# Patient Record
Sex: Female | Born: 1995 | Race: White | Hispanic: Yes | Marital: Single | Smoking: Current every day smoker
Health system: Southern US, Community
[De-identification: ages and names within clinical notes are randomized; demographics above are authoritative.]

## PROBLEM LIST (undated history)

## (undated) DIAGNOSIS — K567 Ileus, unspecified: Secondary | ICD-10-CM

## (undated) HISTORY — PX: CHOLECYSTECTOMY: SHX55

## (undated) HISTORY — PX: APPENDECTOMY: SHX54

---

## 2017-06-06 ENCOUNTER — Encounter (HOSPITAL_COMMUNITY): Payer: Self-pay

## 2017-06-06 ENCOUNTER — Emergency Department (HOSPITAL_COMMUNITY): Payer: BLUE CROSS/BLUE SHIELD

## 2017-06-06 ENCOUNTER — Inpatient Hospital Stay (HOSPITAL_COMMUNITY)
Admission: EM | Admit: 2017-06-06 | Discharge: 2017-06-11 | DRG: 880 | Discharge status: Against Medical Advice | Payer: BLUE CROSS/BLUE SHIELD | Attending: Internal Medicine | Admitting: Internal Medicine

## 2017-06-06 DIAGNOSIS — J9600 Acute respiratory failure, unspecified whether with hypoxia or hypercapnia: Secondary | ICD-10-CM | POA: Diagnosis not present

## 2017-06-06 DIAGNOSIS — R1032 Left lower quadrant pain: Secondary | ICD-10-CM | POA: Diagnosis not present

## 2017-06-06 DIAGNOSIS — G894 Chronic pain syndrome: Secondary | ICD-10-CM | POA: Diagnosis present

## 2017-06-06 DIAGNOSIS — F445 Conversion disorder with seizures or convulsions: Principal | ICD-10-CM | POA: Diagnosis present

## 2017-06-06 DIAGNOSIS — Z9049 Acquired absence of other specified parts of digestive tract: Secondary | ICD-10-CM

## 2017-06-06 DIAGNOSIS — F1210 Cannabis abuse, uncomplicated: Secondary | ICD-10-CM | POA: Diagnosis present

## 2017-06-06 DIAGNOSIS — Z978 Presence of other specified devices: Secondary | ICD-10-CM

## 2017-06-06 DIAGNOSIS — F1123 Opioid dependence with withdrawal: Secondary | ICD-10-CM | POA: Diagnosis present

## 2017-06-06 DIAGNOSIS — E876 Hypokalemia: Secondary | ICD-10-CM | POA: Diagnosis not present

## 2017-06-06 DIAGNOSIS — N760 Acute vaginitis: Secondary | ICD-10-CM | POA: Diagnosis present

## 2017-06-06 DIAGNOSIS — R569 Unspecified convulsions: Secondary | ICD-10-CM | POA: Diagnosis not present

## 2017-06-06 DIAGNOSIS — F1020 Alcohol dependence, uncomplicated: Secondary | ICD-10-CM | POA: Diagnosis present

## 2017-06-06 DIAGNOSIS — G930 Cerebral cysts: Secondary | ICD-10-CM | POA: Diagnosis present

## 2017-06-06 DIAGNOSIS — F1410 Cocaine abuse, uncomplicated: Secondary | ICD-10-CM | POA: Diagnosis present

## 2017-06-06 DIAGNOSIS — F17210 Nicotine dependence, cigarettes, uncomplicated: Secondary | ICD-10-CM | POA: Diagnosis present

## 2017-06-06 DIAGNOSIS — J9601 Acute respiratory failure with hypoxia: Secondary | ICD-10-CM | POA: Diagnosis present

## 2017-06-06 DIAGNOSIS — R109 Unspecified abdominal pain: Secondary | ICD-10-CM

## 2017-06-06 DIAGNOSIS — A749 Chlamydial infection, unspecified: Secondary | ICD-10-CM | POA: Diagnosis present

## 2017-06-06 DIAGNOSIS — B9689 Other specified bacterial agents as the cause of diseases classified elsewhere: Secondary | ICD-10-CM | POA: Diagnosis present

## 2017-06-06 DIAGNOSIS — F1920 Other psychoactive substance dependence, uncomplicated: Secondary | ICD-10-CM

## 2017-06-06 HISTORY — DX: Ileus, unspecified: K56.7

## 2017-06-06 LAB — COMPREHENSIVE METABOLIC PANEL
ALT: 150 U/L — ABNORMAL HIGH (ref 14–54)
ANION GAP: 10 (ref 5–15)
AST: 90 U/L — AB (ref 15–41)
Albumin: 4.6 g/dL (ref 3.5–5.0)
Alkaline Phosphatase: 57 U/L (ref 38–126)
BILIRUBIN TOTAL: 0.3 mg/dL (ref 0.3–1.2)
BUN: 12 mg/dL (ref 6–20)
CHLORIDE: 105 mmol/L (ref 101–111)
CO2: 24 mmol/L (ref 22–32)
Calcium: 9.9 mg/dL (ref 8.9–10.3)
Creatinine, Ser: 0.71 mg/dL (ref 0.44–1.00)
Glucose, Bld: 117 mg/dL — ABNORMAL HIGH (ref 65–99)
POTASSIUM: 4.1 mmol/L (ref 3.5–5.1)
Sodium: 139 mmol/L (ref 135–145)
TOTAL PROTEIN: 8.5 g/dL — AB (ref 6.5–8.1)

## 2017-06-06 LAB — WET PREP, GENITAL
Sperm: NONE SEEN
Trich, Wet Prep: NONE SEEN
Yeast Wet Prep HPF POC: NONE SEEN

## 2017-06-06 LAB — CBC
HEMATOCRIT: 41 % (ref 36.0–46.0)
Hemoglobin: 14.1 g/dL (ref 12.0–15.0)
MCH: 28.7 pg (ref 26.0–34.0)
MCHC: 34.4 g/dL (ref 30.0–36.0)
MCV: 83.5 fL (ref 78.0–100.0)
Platelets: 244 10*3/uL (ref 150–400)
RBC: 4.91 MIL/uL (ref 3.87–5.11)
RDW: 13.6 % (ref 11.5–15.5)
WBC: 10.1 10*3/uL (ref 4.0–10.5)

## 2017-06-06 LAB — URINALYSIS, ROUTINE W REFLEX MICROSCOPIC
BILIRUBIN URINE: NEGATIVE
Glucose, UA: NEGATIVE mg/dL
Hgb urine dipstick: NEGATIVE
KETONES UR: 20 mg/dL — AB
LEUKOCYTES UA: NEGATIVE
NITRITE: NEGATIVE
PH: 5 (ref 5.0–8.0)
Protein, ur: NEGATIVE mg/dL
Specific Gravity, Urine: 1.023 (ref 1.005–1.030)

## 2017-06-06 LAB — ACETAMINOPHEN LEVEL

## 2017-06-06 LAB — MAGNESIUM: Magnesium: 2 mg/dL (ref 1.7–2.4)

## 2017-06-06 LAB — I-STAT BETA HCG BLOOD, ED (MC, WL, AP ONLY): I-stat hCG, quantitative: <5 m[IU]/mL (ref <5)

## 2017-06-06 LAB — LIPASE, BLOOD
LIPASE: 29 U/L (ref 11–51)
Lipase: 29 U/L (ref 11–51)

## 2017-06-06 LAB — CBG MONITORING, ED: Glucose-Capillary: 120 mg/dL — ABNORMAL HIGH (ref 65–99)

## 2017-06-06 LAB — RAPID URINE DRUG SCREEN, HOSP PERFORMED
AMPHETAMINES: NOT DETECTED
BARBITURATES: NOT DETECTED
BENZODIAZEPINES: POSITIVE — AB
COCAINE: POSITIVE — AB
Opiates: POSITIVE — AB
TETRAHYDROCANNABINOL: POSITIVE — AB

## 2017-06-06 LAB — TRIGLYCERIDES: Triglycerides: 54 mg/dL (ref <150)

## 2017-06-06 LAB — AMYLASE: AMYLASE: 81 U/L (ref 28–100)

## 2017-06-06 LAB — MRSA PCR SCREENING: MRSA BY PCR: POSITIVE — AB

## 2017-06-06 LAB — SALICYLATE LEVEL

## 2017-06-06 MED ORDER — HYDROMORPHONE HCL 1 MG/ML IJ SOLN
0.5000 mg | Freq: Once | INTRAMUSCULAR | Status: AC
  Administered 2017-06-06: 0.5 mg via INTRAVENOUS
  Filled 2017-06-06: qty 1

## 2017-06-06 MED ORDER — LIDOCAINE HCL 1 % IJ SOLN
INTRAMUSCULAR | Status: AC
  Filled 2017-06-06: qty 20

## 2017-06-06 MED ORDER — AZITHROMYCIN 250 MG PO TABS: 1000.0000 mg | ORAL_TABLET | Freq: Once | ORAL | Status: DC

## 2017-06-06 MED ORDER — PROPOFOL 1000 MG/100ML IV EMUL
0.0000 ug/kg/min | INTRAVENOUS | Status: DC
  Filled 2017-06-06: qty 100

## 2017-06-06 MED ORDER — FENTANYL CITRATE (PF) 100 MCG/2ML IJ SOLN: 100.0000 ug | INTRAMUSCULAR | Status: DC | PRN

## 2017-06-06 MED ORDER — IOPAMIDOL (ISOVUE-300) INJECTION 61%
INTRAVENOUS | Status: AC
  Filled 2017-06-06: qty 100

## 2017-06-06 MED ORDER — LORAZEPAM 2 MG/ML IJ SOLN
2.0000 mg | Freq: Once | INTRAMUSCULAR | Status: AC
  Administered 2017-06-06: 2 mg via INTRAVENOUS
  Filled 2017-06-06: qty 1

## 2017-06-06 MED ORDER — MIDAZOLAM HCL 2 MG/2ML IJ SOLN
2.0000 mg | Freq: Once | INTRAMUSCULAR | Status: AC
  Administered 2017-06-06: 2 mg via INTRAVENOUS

## 2017-06-06 MED ORDER — AMMONIA AROMATIC IN INHA
RESPIRATORY_TRACT | Status: AC
  Filled 2017-06-06: qty 10

## 2017-06-06 MED ORDER — LORAZEPAM 2 MG/ML IJ SOLN
INTRAMUSCULAR | Status: AC
  Filled 2017-06-06: qty 1

## 2017-06-06 MED ORDER — SODIUM CHLORIDE 0.9 % IV SOLN
750.0000 mg | Freq: Two times a day (BID) | INTRAVENOUS | Status: DC
  Administered 2017-06-06 – 2017-06-07 (×2): 750 mg via INTRAVENOUS
  Filled 2017-06-06 (×3): qty 7.5

## 2017-06-06 MED ORDER — CEFTRIAXONE SODIUM 250 MG IJ SOLR
250.0000 mg | Freq: Once | INTRAMUSCULAR | Status: AC
  Administered 2017-06-06: 250 mg via INTRAMUSCULAR
  Filled 2017-06-06: qty 250

## 2017-06-06 MED ORDER — MIDAZOLAM HCL 2 MG/2ML IJ SOLN
INTRAMUSCULAR | Status: AC
  Filled 2017-06-06: qty 2

## 2017-06-06 MED ORDER — FAMOTIDINE 20 MG PO TABS: 20.0000 mg | ORAL_TABLET | Freq: Two times a day (BID) | ORAL | Status: DC

## 2017-06-06 MED ORDER — IOPAMIDOL (ISOVUE-300) INJECTION 61%
100.0000 mL | Freq: Once | INTRAVENOUS | Status: AC | PRN
  Administered 2017-06-06: 100 mL via INTRAVENOUS

## 2017-06-06 MED ORDER — SODIUM CHLORIDE 0.9 % IV SOLN
2000.0000 mg | Freq: Once | INTRAVENOUS | Status: AC
  Administered 2017-06-06: 2000 mg via INTRAVENOUS
  Filled 2017-06-06: qty 20

## 2017-06-06 MED ORDER — METRONIDAZOLE 50 MG/ML ORAL SUSPENSION
500.0000 mg | Freq: Two times a day (BID) | ORAL | Status: DC
  Administered 2017-06-07 – 2017-06-09 (×3): 500 mg
  Filled 2017-06-06 (×6): qty 10

## 2017-06-06 MED ORDER — PROPOFOL 1000 MG/100ML IV EMUL
0.0000 ug/kg/min | INTRAVENOUS | Status: DC
  Administered 2017-06-06: 5 ug/kg/min via INTRAVENOUS

## 2017-06-06 MED ORDER — MIDAZOLAM HCL 2 MG/2ML IJ SOLN
1.0000 mg | Freq: Once | INTRAMUSCULAR | Status: AC
  Administered 2017-06-06: 1 mg via INTRAVENOUS

## 2017-06-06 MED ORDER — ROCURONIUM BROMIDE 50 MG/5ML IV SOLN
INTRAVENOUS | Status: DC | PRN
  Administered 2017-06-06: 60 mg via INTRAVENOUS

## 2017-06-06 MED ORDER — PROPOFOL 1000 MG/100ML IV EMUL
INTRAVENOUS | Status: AC
  Filled 2017-06-06: qty 100

## 2017-06-06 MED ORDER — FENTANYL CITRATE (PF) 100 MCG/2ML IJ SOLN
50.0000 ug | Freq: Once | INTRAMUSCULAR | Status: AC
  Administered 2017-06-06: 50 ug via INTRAVENOUS
  Filled 2017-06-06: qty 2

## 2017-06-06 MED ORDER — ONDANSETRON HCL 4 MG/2ML IJ SOLN
4.0000 mg | Freq: Once | INTRAMUSCULAR | Status: AC
  Administered 2017-06-06: 4 mg via INTRAVENOUS
  Filled 2017-06-06: qty 2

## 2017-06-06 MED ORDER — ETOMIDATE 2 MG/ML IV SOLN
INTRAVENOUS | Status: AC | PRN
  Administered 2017-06-06: 20 mg via INTRAVENOUS

## 2017-06-06 MED ORDER — MIDAZOLAM HCL 2 MG/2ML IJ SOLN: 1.0000 mg | Freq: Once | INTRAMUSCULAR | Status: DC

## 2017-06-06 MED ORDER — LORAZEPAM 2 MG/ML IJ SOLN
INTRAMUSCULAR | Status: AC | PRN
Start: 2017-06-06 — End: 2017-06-06
  Administered 2017-06-06 (×2): 2 mg via INTRAVENOUS

## 2017-06-06 MED ORDER — DEXMEDETOMIDINE HCL IN NACL 200 MCG/50ML IV SOLN
0.4000 ug/kg/h | INTRAVENOUS | Status: DC
  Administered 2017-06-06: 0.8 ug/kg/h via INTRAVENOUS
  Administered 2017-06-06 – 2017-06-07 (×2): 0.4 ug/kg/h via INTRAVENOUS
  Filled 2017-06-06 (×4): qty 50

## 2017-06-06 MED ORDER — HEPARIN SODIUM (PORCINE) 5000 UNIT/ML IJ SOLN
5000.0000 [IU] | Freq: Three times a day (TID) | INTRAMUSCULAR | Status: DC
  Administered 2017-06-06 – 2017-06-10 (×8): 5000 [IU] via SUBCUTANEOUS
  Filled 2017-06-06 (×10): qty 1

## 2017-06-06 MED ORDER — LORAZEPAM 2 MG/ML IJ SOLN
1.0000 mg | Freq: Once | INTRAMUSCULAR | Status: DC
  Filled 2017-06-06: qty 1

## 2017-06-06 MED ORDER — SODIUM CHLORIDE 0.9 % IV SOLN
250.0000 mL | INTRAVENOUS | Status: DC | PRN
  Administered 2017-06-07: 250 mL via INTRAVENOUS

## 2017-06-06 MED ORDER — SODIUM CHLORIDE 0.9 % IV BOLUS (SEPSIS)
1000.0000 mL | Freq: Once | INTRAVENOUS | Status: AC
  Administered 2017-06-06: 1000 mL via INTRAVENOUS

## 2017-06-06 MED ORDER — FAMOTIDINE 20 MG PO TABS
20.0000 mg | ORAL_TABLET | Freq: Two times a day (BID) | ORAL | Status: DC
  Administered 2017-06-07 – 2017-06-10 (×8): 20 mg via ORAL
  Filled 2017-06-06 (×8): qty 1
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
## 2017-06-06 NOTE — ED Triage Notes (Signed)
Patient c/o LLQ pain. Patient states, that she forgot her meds for Ileus and liver problems. Patient sattes the pain started at 0730 today.

## 2017-06-06 NOTE — Progress Notes (Signed)
eLink Physician-Brief Progress Note Patient Name: Roe RutherfordSarah Heidt DOB: 02/13/1996 MRN: [id number]   Date of Service  06/06/2017  HPI/Events of Note  Pt started to have intermittent seizure   eICU Interventions  Given versed 1mg  and then 2mg  Started on precedex drip with no improvement Discussed with neuro who will evaluate the patient      Intervention Category Evaluation Type: Other  Avien Taha 06/06/2017, 8:40 PM

## 2017-06-06 NOTE — Consult Note (Signed)
Seizures- likely substance abuse related  - Load with 2 g Keppra, 750 mg BID maintenance - Ct head shows no acute findings, arachnoid cyst - UDS +ve for cocaine, Opiates, benzo and barbiturates, THC - patient moving all 4 extremities while intubated, no focal deficits Routine EEG   Will continue to follow

## 2017-06-06 NOTE — ED Provider Notes (Signed)
[facility]-EMERGENCY DEPT Provider Note   CSN: [id number] Arrival date & time: 06/06/17  1106     History   Chief Complaint Chief Complaint  Patient presents with  . Emesis  . Abdominal Pain    HPI Connie Mckay is a 21 y.o. female.  The history is provided by the patient and medical records. No language interpreter was used.  Emesis   Associated symptoms include abdominal pain. Pertinent negatives include no diarrhea.  Abdominal Pain   Associated symptoms include nausea and vomiting. Pertinent negatives include diarrhea.  Connie Mckay is a 21 y.o. female who presents to the Emergency Department complaining of acute onset of left lower quadrant pain which began this morning. Pain is constant, 10/10. Unable to characterize, stating that "It just hurts". Denies radiation of the pain. Notes somewhat similar episode of abdominal pain in the past when her "liver labs were high". No EMR records available at this time. Per patient, history of elevated liver labs which she takes medication for, but does not know any further information or name of medication. 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 Also notes history of hepatitis. No other known medical problems. No medications taken prior to arrival for symptoms. No alleviating or aggravating factors noted. Associated with nausea and vomiting. No fevers, chills, back pain, urinary symptoms, back pain, chest pain or shortness of breath.    Past Medical History:  Diagnosis Date  . Ileus [facility](HCC)     Patient Active Problem List   Diagnosis Date Noted  . Abdominal pain 06/06/2017    Past Surgical History:  Procedure Laterality Date  . APPENDECTOMY    . CHOLECYSTECTOMY      OB History    No data available       Home Medications    Prior to Admission medications   Medication Sig Start Date End Date Taking? Authorizing Provider  AMOXICILLIN PO Take 1 tablet by mouth every 4 (four) hours. Take 1 tab  every 4 hours for 2 weeks   Yes [provider]  HYDROmorphone (DILAUDID) 2 MG tablet Take 2 mg by mouth every 4 (four) hours as needed for severe pain.   Yes [provider]    Family History History reviewed. No pertinent family history.  Social History Social History  Substance Use Topics  . Smoking status: Current Every Day Smoker    Packs/day: 0.50    Types: Cigarettes  . Smokeless tobacco: Never Used  . Alcohol use No     Allergies   Patient has no known allergies.   Review of Systems Review of Systems  Gastrointestinal: Positive for abdominal pain, nausea and vomiting. Negative for blood in stool and diarrhea.  All other systems reviewed and are negative.    Physical Exam Updated Vital Signs BP (!) 91/52   Pulse 86   Temp 98.4 F (36.9 C)   Resp 18   Ht 5\' 2"  (1.575 m)   Wt 58.1 kg (128 lb)   LMP 05/30/2017 (Approximate) Comment: pt intubated   SpO2 98%   BMI 23.41 kg/m   Physical Exam  Constitutional: She is oriented to person, place, and time. She appears well-developed and well-nourished. No distress.  Appears uncomfortable and in pain.  HENT:  Head: Normocephalic and atraumatic.  Cardiovascular: Normal rate, regular rhythm and normal heart sounds.   No murmur heard. Pulmonary/Chest: Effort normal and breath sounds normal. No respiratory distress.  Abdominal: Soft. She exhibits no distension.  Tenderness to palpation of left lower quadrant with guarding.  Musculoskeletal: She exhibits no edema.  Neurological: She is alert and oriented to person, place, and time.  Skin: Skin is warm and dry.  Nursing note and vitals reviewed.    ED Treatments / Results  Labs (all labs ordered are listed, but only abnormal results are displayed) Labs Reviewed  WET PREP, GENITAL - Abnormal; Notable for the following:       Result Value   Clue Cells Wet Prep HPF POC PRESENT (*)    WBC, Wet Prep HPF POC MANY (*)    All other components within  normal limits  COMPREHENSIVE METABOLIC PANEL - Abnormal; Notable for the following:    Glucose, Bld 117 (*)    Total Protein 8.5 (*)    AST 90 (*)    ALT 150 (*)    All other components within normal limits  URINALYSIS, ROUTINE W REFLEX MICROSCOPIC - Abnormal; Notable for the following:    APPearance HAZY (*)    Ketones, ur 20 (*)    All other components within normal limits  RAPID URINE DRUG SCREEN, HOSP PERFORMED - Abnormal; Notable for the following:    Opiates POSITIVE (*)    Cocaine POSITIVE (*)    Benzodiazepines POSITIVE (*)    Tetrahydrocannabinol POSITIVE (*)    All other components within normal limits  CBG MONITORING, ED - Abnormal; Notable for the following:    Glucose-Capillary 120 (*)    All other components within normal limits  LIPASE, BLOOD  CBC  MAGNESIUM  TRIGLYCERIDES  HIV ANTIBODY (ROUTINE TESTING)  AMYLASE  LIPASE, BLOOD  HEPATITIS PANEL, ACUTE  ACETAMINOPHEN LEVEL  SALICYLATE LEVEL  LIPASE, BLOOD  TRIGLYCERIDES  I-STAT BETA HCG BLOOD, ED ([facility], [facility], [facility] ONLY)  GC/CHLAMYDIA PROBE AMP ([place]) NOT AT [facility]    EKG  EKG Interpretation None       Radiology Ct Head Wo Contrast  Result Date: 06/06/2017 CLINICAL DATA:  21 y/o F; new seizure. Now intubated. Initial examination. EXAM: CT HEAD WITHOUT CONTRAST TECHNIQUE: Contiguous axial images were obtained from the base of the skull through the vertex without intravenous contrast. COMPARISON:  None. FINDINGS: Brain: No evidence of acute infarct, intracranial hemorrhage, hydrocephalus, focal mass effect of the brain parenchyma. There is a prominent left middle cranial fossa extra-axial space measuring 28 x 30 x 22 mm (AP x ML x CC series 2, image 6 and series 4, image 28) probably representing an arachnoid cyst. The structure exerts mild mass effect on the left anteromedial temporal lobe. Vascular: No hyperdense vessel or unexpected calcification. Skull: Normal. Negative for fracture or focal lesion.  Sinuses/Orbits: No acute finding. Other: None. IMPRESSION: 1. No acute intracranial abnormality identified. 2. 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 The structure exerts mild mass effect on the left anteromedial temporal lobe. Consider MRI of the brain with and without contrast for further characterization Electronically Signed   By: Mitzi Hansen M.D.   On: 06/06/2017 16:29   Dg Chest Port 1 View  Result Date: 06/06/2017 CLINICAL DATA:  Status post intubation and OG tube placement today. EXAM: PORTABLE CHEST 1 VIEW COMPARISON:  None. FINDINGS: Endotracheal tube is in place with the tip in good position 2.5 cm above the carina. OG tube courses into the stomach and below the inferior margin of the film. Lungs are clear. Heart size is normal. No pneumothorax or pleural fluid. No bony abnormality. IMPRESSION:  ETT and NG tube in good position. Lungs clear.  No acute disease. Electronically Signed   By: Drusilla Kanner M.D.   On: 06/06/2017 16:14    Procedures Procedures (including critical care time)  CRITICAL CARE Performed by: Chase Picket Ward  Total critical care time: 45 minutes  Critical care time was exclusive of separately billable procedures and treating other patients.  Critical care was necessary to treat or prevent imminent or life-threatening deterioration.  Critical care was time spent personally by me on the following activities: development of treatment plan with patient and/or surrogate as well as nursing, discussions with consultants, evaluation of patient's response to treatment, examination of patient, obtaining history from patient or surrogate, ordering and performing treatments and interventions, ordering and review of laboratory studies, ordering and review of radiographic studies, pulse oximetry and re-evaluation of patient's condition.   Medications Ordered in ED Medications  iopamidol  (ISOVUE-300) 61 % injection (not administered)  ammonia inhalant (  Not Given 06/06/17 1519)  LORazepam (ATIVAN) injection 1 mg (0 mg Intravenous Not Given 06/06/17 1508)  LORazepam (ATIVAN) injection 1 mg (1 mg Intravenous Not Given 06/06/17 1509)  cefTRIAXone (ROCEPHIN) injection 250 mg (not administered)  0.9 %  sodium chloride infusion (not administered)  heparin injection 5,000 Units (not administered)  famotidine (PEPCID) tablet 20 mg (not administered)  fentaNYL (SUBLIMAZE) injection 100 mcg (not administered)  fentaNYL (SUBLIMAZE) injection 100 mcg (not administered)  propofol (DIPRIVAN) 1000 MG/100ML infusion (not administered)  metroNIDAZOLE (FLAGYL) 50 mg/ml oral suspension 500 mg (not administered)  sodium chloride 0.9 % bolus 1,000 mL (1,000 mLs Intravenous New Bag/Given 06/06/17 1239)  HYDROmorphone (DILAUDID) injection 0.5 mg (0.5 mg Intravenous Given 06/06/17 1240)  ondansetron (ZOFRAN) injection 4 mg (4 mg Intravenous Given 06/06/17 1242)  iopamidol (ISOVUE-300) 61 % injection 100 mL (100 mLs Intravenous Contrast Given 06/06/17 1608)  LORazepam (ATIVAN) injection (2 mg Intravenous Given 06/06/17 1356)  etomidate (AMIDATE) injection (20 mg Intravenous Given 06/06/17 1401)  levETIRAcetam (KEPPRA) 2,000 mg in sodium chloride 0.9 % 100 mL IVPB (0 mg Intravenous Stopped 06/06/17 1512)  fentaNYL (SUBLIMAZE) injection 50 mcg (50 mcg Intravenous Given 06/06/17 1531)  LORazepam (ATIVAN) injection 2 mg (2 mg Intravenous Given 06/06/17 1533)     Initial Impression / Assessment and Plan / ED Course  I have reviewed the triage vital signs and the nursing notes.  Pertinent labs & imaging results that were available during my care of the patient were reviewed by me and considered in my medical decision making (see chart for details).    Connie Mckay is a 21 y.o. female who presented to ED initially for acute onset of left lower quadrant pain. Abdomen with significant tenderness to LLQ with guarding.  Appeared quite uncomfortable. Pain and nausea meds ordered along with fluids and CT abd/pelvis. Nursing staff informed me of seizure-like activity. Myself and Dr. Rhunette Croft at bedside. Patient received multiple doses of Ativan with no improvement in seizure-like activity. Decision was made to intubate to protect airway and control seizure. Intubation performed by Dr. Rhunette Croft. Patient placed on propofol gtt. Neurology consulted who recommends Keppra load which was given. Critical care consulted who will admit.   Patient seen by and discussed with Dr. Rhunette Croft who agrees with treatment plan.   Final Clinical Impressions(s) / ED Diagnoses   Final diagnoses:  Seizure-like activity (HCC)  Left lower quadrant pain    New Prescriptions New Prescriptions   No medications on file     Ward, Chase Picket, PA-C  06/06/17 1647    Derwood KaplanNanavati, Ankit, MD 06/06/17 1658

## 2017-06-06 NOTE — Procedures (Signed)
Extubation Procedure Note  Patient Details:   Name: Connie RutherfordSarah Mckay DOB: 12/21/1995 MRN: [id number]   Airway Documentation:     Evaluation  O2 sats: stable throughout Complications: No apparent complications Patient did tolerate procedure well. Bilateral Breath Sounds: Clear   No  Patient tolerated wean. Patient wild in bed. Positive for cuff leak. MD ordered to extubate. Patient extubated to room air. Patient unable to verbalize. RN and MD at bedside.   Ancil BoozerSmallwood, Brice Potteiger 06/06/2017, 6:21 PM

## 2017-06-06 NOTE — ED Notes (Signed)
Pt refused CT at this time due to feeling nauseated. Pt asked RN to leave room at this time.

## 2017-06-06 NOTE — Progress Notes (Signed)
Pt transported to ICU with Vent.

## 2017-06-06 NOTE — Progress Notes (Signed)
Upon rounding, RN found patient violently convulsing and gasping for air (became tachycardic and tachypneic). Patient initially responsive, but then became unresponsive. RN contacted E-Link via room camera and called charge RN. Patient intermittetly convulsing for 3-7 minutes with 1-2 min of rest. Patient given 3 mg of Versed in total and started on Precedex drip at 0.4 mcg. Precedex increased to 1.2 mcg per Mannam, MD. Patient responded well to medications and stopped seizure activity. Neuro MD assessed patient. Plan is for patient to [facility]. Vitals currently (HR 83 O2 95 BP 90/43 RR 24). Will decrease Precedex for more stable pressure per Deterding, MD. Will continue to monitor patient.
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
## 2017-06-06 NOTE — Progress Notes (Signed)
eLink Physician-Brief Progress Note Patient Name: Connie RutherfordSarah Valenti DOB: 12/25/1995 MRN: [id number]   Date of Service  06/06/2017  HPI/Events of Note  Discussion with Neuro.  Patient with seizure activity pseudo vs actual.  Also concern for unknown ingestion.   eICU Interventions  [facility] for cont EEG monitoring per Neuro recommendation Contact poison control for additional recommendations.     Intervention Category Intermediate Interventions: Other:  DETERDING,ELIZABETH 06/06/2017, 9:26 PM

## 2017-06-07 ENCOUNTER — Observation Stay (HOSPITAL_COMMUNITY): Payer: BLUE CROSS/BLUE SHIELD

## 2017-06-07 DIAGNOSIS — N760 Acute vaginitis: Secondary | ICD-10-CM | POA: Diagnosis present

## 2017-06-07 DIAGNOSIS — A749 Chlamydial infection, unspecified: Secondary | ICD-10-CM | POA: Diagnosis present

## 2017-06-07 DIAGNOSIS — Z9049 Acquired absence of other specified parts of digestive tract: Secondary | ICD-10-CM | POA: Diagnosis not present

## 2017-06-07 DIAGNOSIS — G930 Cerebral cysts: Secondary | ICD-10-CM | POA: Diagnosis present

## 2017-06-07 DIAGNOSIS — R1084 Generalized abdominal pain: Secondary | ICD-10-CM | POA: Diagnosis not present

## 2017-06-07 DIAGNOSIS — F1190 Opioid use, unspecified, uncomplicated: Secondary | ICD-10-CM | POA: Diagnosis not present

## 2017-06-07 DIAGNOSIS — F1290 Cannabis use, unspecified, uncomplicated: Secondary | ICD-10-CM | POA: Diagnosis not present

## 2017-06-07 DIAGNOSIS — F17210 Nicotine dependence, cigarettes, uncomplicated: Secondary | ICD-10-CM | POA: Diagnosis present

## 2017-06-07 DIAGNOSIS — Z634 Disappearance and death of family member: Secondary | ICD-10-CM | POA: Diagnosis not present

## 2017-06-07 DIAGNOSIS — R1032 Left lower quadrant pain: Secondary | ICD-10-CM | POA: Diagnosis present

## 2017-06-07 DIAGNOSIS — R109 Unspecified abdominal pain: Secondary | ICD-10-CM | POA: Diagnosis not present

## 2017-06-07 DIAGNOSIS — F1910 Other psychoactive substance abuse, uncomplicated: Secondary | ICD-10-CM | POA: Diagnosis not present

## 2017-06-07 DIAGNOSIS — F1123 Opioid dependence with withdrawal: Secondary | ICD-10-CM | POA: Diagnosis present

## 2017-06-07 DIAGNOSIS — F445 Conversion disorder with seizures or convulsions: Secondary | ICD-10-CM | POA: Diagnosis present

## 2017-06-07 DIAGNOSIS — F1490 Cocaine use, unspecified, uncomplicated: Secondary | ICD-10-CM | POA: Diagnosis not present

## 2017-06-07 DIAGNOSIS — B9689 Other specified bacterial agents as the cause of diseases classified elsewhere: Secondary | ICD-10-CM | POA: Diagnosis present

## 2017-06-07 DIAGNOSIS — F1920 Other psychoactive substance dependence, uncomplicated: Secondary | ICD-10-CM | POA: Diagnosis not present

## 2017-06-07 DIAGNOSIS — F1020 Alcohol dependence, uncomplicated: Secondary | ICD-10-CM | POA: Diagnosis present

## 2017-06-07 DIAGNOSIS — F1210 Cannabis abuse, uncomplicated: Secondary | ICD-10-CM | POA: Diagnosis present

## 2017-06-07 DIAGNOSIS — F1410 Cocaine abuse, uncomplicated: Secondary | ICD-10-CM | POA: Diagnosis present

## 2017-06-07 DIAGNOSIS — F1390 Sedative, hypnotic, or anxiolytic use, unspecified, uncomplicated: Secondary | ICD-10-CM | POA: Diagnosis not present

## 2017-06-07 DIAGNOSIS — E876 Hypokalemia: Secondary | ICD-10-CM | POA: Diagnosis not present

## 2017-06-07 DIAGNOSIS — R740 Nonspecific elevation of levels of transaminase and lactic acid dehydrogenase [LDH]: Secondary | ICD-10-CM | POA: Diagnosis not present

## 2017-06-07 DIAGNOSIS — G894 Chronic pain syndrome: Secondary | ICD-10-CM | POA: Diagnosis present

## 2017-06-07 DIAGNOSIS — J9601 Acute respiratory failure with hypoxia: Secondary | ICD-10-CM | POA: Diagnosis present

## 2017-06-07 DIAGNOSIS — G8929 Other chronic pain: Secondary | ICD-10-CM | POA: Diagnosis not present

## 2017-06-07 LAB — POCT I-STAT 3, ART BLOOD GAS (G3+)
ACID-BASE DEFICIT: 1 mmol/L (ref 0.0–2.0)
BICARBONATE: 22.3 mmol/L (ref 20.0–28.0)
O2 SAT: 98 %
PCO2 ART: 32.3 mmHg (ref 32.0–48.0)
PO2 ART: 94 mmHg (ref 83.0–108.0)
Patient temperature: 99.7
TCO2: 23 mmol/L (ref 0–100)
pH, Arterial: 7.449 (ref 7.350–7.450)

## 2017-06-07 LAB — BASIC METABOLIC PANEL
ANION GAP: 9 (ref 5–15)
Anion gap: 4 — ABNORMAL LOW (ref 5–15)
BUN: 8 mg/dL (ref 6–20)
BUN: 7 mg/dL (ref 6–20)
CALCIUM: 8.3 mg/dL — AB (ref 8.9–10.3)
CHLORIDE: 111 mmol/L (ref 101–111)
CO2: 21 mmol/L — ABNORMAL LOW (ref 22–32)
CO2: 24 mmol/L (ref 22–32)
CREATININE: 0.68 mg/dL (ref 0.44–1.00)
Calcium: 8.3 mg/dL — ABNORMAL LOW (ref 8.9–10.3)
Chloride: 110 mmol/L (ref 101–111)
Creatinine, Ser: 0.65 mg/dL (ref 0.44–1.00)
GFR calc Af Amer: >60 mL/min (ref >60)
GFR calc Af Amer: >60 mL/min (ref >60)
GFR calc non Af Amer: >60 mL/min (ref >60)
Glucose, Bld: 86 mg/dL (ref 65–99)
Glucose, Bld: 98 mg/dL (ref 65–99)
POTASSIUM: 3.7 mmol/L (ref 3.5–5.1)
POTASSIUM: 3.6 mmol/L (ref 3.5–5.1)
SODIUM: 140 mmol/L (ref 135–145)
Sodium: 139 mmol/L (ref 135–145)

## 2017-06-07 LAB — PROCALCITONIN

## 2017-06-07 LAB — HEPATIC FUNCTION PANEL
ALT: 99 U/L — AB (ref 14–54)
AST: 62 U/L — AB (ref 15–41)
Albumin: 3.3 g/dL — ABNORMAL LOW (ref 3.5–5.0)
Alkaline Phosphatase: 41 U/L (ref 38–126)
BILIRUBIN INDIRECT: 0.6 mg/dL (ref 0.3–0.9)
Bilirubin, Direct: 0.2 mg/dL (ref 0.1–0.5)
TOTAL PROTEIN: 6.1 g/dL — AB (ref 6.5–8.1)
Total Bilirubin: 0.8 mg/dL (ref 0.3–1.2)

## 2017-06-07 LAB — TROPONIN I: Troponin I: <0.03 ng/mL (ref <0.03)

## 2017-06-07 LAB — LIPASE, BLOOD: Lipase: 30 U/L (ref 11–51)

## 2017-06-07 LAB — CK TOTAL AND CKMB (NOT AT ARMC)
CK, MB: 0.8 ng/mL (ref 0.5–5.0)
RELATIVE INDEX: INVALID (ref 0.0–2.5)
Total CK: 63 U/L (ref 38–234)

## 2017-06-07 LAB — LACTIC ACID, PLASMA
Lactic Acid, Venous: 0.9 mmol/L (ref 0.5–1.9)
Lactic Acid, Venous: 0.7 mmol/L (ref 0.5–1.9)

## 2017-06-07 LAB — HIV ANTIBODY (ROUTINE TESTING W REFLEX): HIV SCREEN 4TH GENERATION: NONREACTIVE

## 2017-06-07 MED ORDER — LORAZEPAM 2 MG/ML IJ SOLN
1.0000 mg | INTRAMUSCULAR | Status: DC | PRN
  Administered 2017-06-07: 1 mg via INTRAVENOUS
  Filled 2017-06-07: qty 1

## 2017-06-07 MED ORDER — LORAZEPAM 2 MG/ML IJ SOLN
1.0000 mg | INTRAMUSCULAR | Status: DC | PRN
  Administered 2017-06-08: 2 mg via INTRAVENOUS
  Filled 2017-06-07 (×2): qty 1

## 2017-06-07 MED ORDER — SODIUM CHLORIDE 0.9 % IV SOLN
INTRAVENOUS | Status: DC
  Administered 2017-06-07 – 2017-06-08 (×2): via INTRAVENOUS

## 2017-06-07 MED ORDER — FENTANYL CITRATE (PF) 100 MCG/2ML IJ SOLN
50.0000 ug | INTRAMUSCULAR | Status: DC | PRN
Start: 2017-06-07 — End: 2017-06-09
  Administered 2017-06-07 – 2017-06-08 (×4): 100 ug via INTRAVENOUS
  Administered 2017-06-09: 50 ug via INTRAVENOUS
  Administered 2017-06-09: 100 ug via INTRAVENOUS
  Filled 2017-06-07 (×8): qty 2

## 2017-06-07 MED ORDER — LORAZEPAM 2 MG/ML IJ SOLN
INTRAMUSCULAR | Status: AC
  Administered 2017-06-07: 2 mg
  Filled 2017-06-07: qty 1

## 2017-06-07 MED ORDER — METHADONE HCL 10 MG PO TABS
10.0000 mg | ORAL_TABLET | Freq: Once | ORAL | Status: AC
  Administered 2017-06-07: 10 mg via ORAL
  Filled 2017-06-07: qty 1

## 2017-06-07 MED ORDER — METHADONE HCL 10 MG/ML IJ SOLN: 10.0000 mg | Freq: Once | INTRAMUSCULAR | Status: DC

## 2017-06-07 MED ORDER — LORAZEPAM 2 MG/ML IJ SOLN: 2.0000 mg | Freq: Once | INTRAMUSCULAR | Status: AC

## 2017-06-07 MED ORDER — FENTANYL CITRATE (PF) 100 MCG/2ML IJ SOLN
25.0000 ug | Freq: Once | INTRAMUSCULAR | Status: AC
  Administered 2017-06-07: 25 ug via INTRAVENOUS
  Filled 2017-06-07: qty 2

## 2017-06-07 MED ORDER — SODIUM CHLORIDE 0.9 % IV BOLUS (SEPSIS)
1000.0000 mL | Freq: Once | INTRAVENOUS | Status: AC
  Administered 2017-06-07: 1000 mL via INTRAVENOUS

## 2017-06-07 NOTE — Progress Notes (Signed)
eLink Physician-Brief Progress Note Patient Name: Roe RutherfordSarah Blundell DOB: 09/01/1996 MRN: [id number]   Date of Service  06/07/2017  HPI/Events of Note  Vomiting - Request for maintenance IV fluid.   eICU Interventions  Will order: 1. 0.9 NaCl to run IV at 75 mL/hour.      Intervention Category Intermediate Interventions: Other:  Lenell AntuSommer,Steven Eugene 06/07/2017, 7:18 PM
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
## 2017-06-07 NOTE — Progress Notes (Signed)
Pt vomited twice after clear liquid dinner. E Link notified.

## 2017-06-07 NOTE — Progress Notes (Addendum)
Initial Nutrition Assessment  INTERVENTION:   Supplement diet once advanced.    NUTRITION DIAGNOSIS:   Inadequate oral intake related to altered GI function, acute illness as evidenced by NPO status.  GOAL:   Patient will meet greater than or equal to 90% of their needs  MONITOR:   Diet advancement, I & O's  REASON FOR ASSESSMENT:   Malnutrition Screening Tool    ASSESSMENT:   Pt with questionable PMH of ileus reportedly on dilaudid/amoxicillin admitted with LLQ pain and developed a seizure.    Neurology following for seizures vs pseudoseizures.   Unable to complete exam at this time. Pt moaning and writhing in bed.  Labs reviewed: +UDS Medications reviewed and include: flagyl  Diet Order:  NPO  Skin:  Reviewed, no issues  Last BM:  unknown  Height:   Ht Readings from Last 1 Encounters:  06/06/17 5\' 2"  (1.575 m)    Weight:   Wt Readings from Last 1 Encounters:  06/07/17 126 lb 5.2 oz (57.3 kg)    Ideal Body Weight:  50 kg  BMI:  Body mass index is 23.1 kg/m.  Estimated Nutritional Needs:   Kcal:  1700-1900  Protein:  70-85 grams  Fluid:  > 2 L/day  EDUCATION NEEDS:   No education needs identified at this time  Kendell BaneHeather Roseann Kees RD, LDN, CNSC [phone] Pager [phone] After Hours Pager

## 2017-06-07 NOTE — Progress Notes (Signed)
Family came in and woke the pt. She began shaking and flailing in seizure-like activity. Family called me to the room. Ativan is only ordered as q 4 hr and has already been given. Dr Aida Raideramiswami notified. He will round on pt next. Upon my return to room, pt no longer shaking.

## 2017-06-07 NOTE — Progress Notes (Signed)
Neuro MD made aware pt localizes to pain and responds to stimulation during her seizure like movements. Verbal order received for Ativan if prolonged seizure activity .

## 2017-06-07 NOTE — Progress Notes (Signed)
Per Dr Aroor instructed, CCM was contacted and asked to consult Psychiatry.

## 2017-06-07 NOTE — Progress Notes (Signed)
EEG completed; results pending.    

## 2017-06-07 NOTE — Progress Notes (Signed)
Pt shaking in bed again, unresponsive. Ativan given. Pt then did return to baseline, moaning and writing c/o pain.

## 2017-06-07 NOTE — Progress Notes (Signed)
Dr Aroor updated the family at length regarding diagnosis, tests,and plan of care.

## 2017-06-07 NOTE — Progress Notes (Signed)
Pt moaning, screaming for "Nurse" c/o 10/10 abdominal pain. PCCM paged.

## 2017-06-07 NOTE — Progress Notes (Signed)
Report called to RN receiving patient. [facility] transferring to [address] [facility]. [facility] currently at bedside.

## 2017-06-07 NOTE — Progress Notes (Signed)
Pt shaking and not responing to me at approximately 0935. I went to go get ativan. Upon my return, pt no longer seizing is moaning again and asking "where am I?". Ativan not given.

## 2017-06-07 NOTE — Progress Notes (Signed)
Pt moaning writing in bed, peeking out window, c/o abdominal pain. CCM paged again as no orders have been entered yet for pain.

## 2017-06-07 NOTE — Progress Notes (Signed)
Pt with seizure-like activity again. Dr Aida Raideramiswami to room. One time order for 2 mg ativan received. Med given. Pt began c/o abdominal pain again.

## 2017-06-07 NOTE — Progress Notes (Signed)
Notified that methadone does not come in IM formula, order changed to po by Dr Colletta Marylandamaswami. PO methadone not loaded in pyxis, will notify for that as well. Fentanyl given as pt has been asking for pain medicine all day.

## 2017-06-07 NOTE — Progress Notes (Signed)
[facility]. [facility]
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
## 2017-06-07 NOTE — Progress Notes (Signed)
Pt shaking violently again, Dr. Laurence SlateAroor called to room.

## 2017-06-07 NOTE — Procedures (Signed)
Date of recording 06/07/2017  Referring physician Ladonna SnideMc Neal Kirkpatrick  Reason for the study 21 year old female complaining of abdominal pain and generalized seizure-like activity  Technical Digital EEG recording using 10-20 international electrode system  Description of the recording When awake posterior dominant rhythm is 9-10 Hz symmetrical reactive and well sustained. During the recording patient had at least 2 events where her right leg was flexed with rhythmic shaking head turns towards the right followed by side-to-side head shaking, EEG did not show any epileptiform activity. Sleep was not obtained.  Impression The EEG is normal with patient recorded in awake state only Clinical events described above are consistent with nonepileptic or pseudoseizure

## 2017-06-08 ENCOUNTER — Inpatient Hospital Stay (HOSPITAL_COMMUNITY): Payer: BLUE CROSS/BLUE SHIELD

## 2017-06-08 DIAGNOSIS — F1390 Sedative, hypnotic, or anxiolytic use, unspecified, uncomplicated: Secondary | ICD-10-CM

## 2017-06-08 DIAGNOSIS — Z634 Disappearance and death of family member: Secondary | ICD-10-CM

## 2017-06-08 DIAGNOSIS — F1490 Cocaine use, unspecified, uncomplicated: Secondary | ICD-10-CM

## 2017-06-08 DIAGNOSIS — F17210 Nicotine dependence, cigarettes, uncomplicated: Secondary | ICD-10-CM

## 2017-06-08 DIAGNOSIS — F1920 Other psychoactive substance dependence, uncomplicated: Secondary | ICD-10-CM

## 2017-06-08 DIAGNOSIS — F1290 Cannabis use, unspecified, uncomplicated: Secondary | ICD-10-CM

## 2017-06-08 DIAGNOSIS — F1190 Opioid use, unspecified, uncomplicated: Secondary | ICD-10-CM

## 2017-06-08 LAB — CBC WITH DIFFERENTIAL/PLATELET
BASOS ABS: 0 10*3/uL (ref 0.0–0.1)
BASOS PCT: 0 %
EOS ABS: 0.2 10*3/uL (ref 0.0–0.7)
EOS PCT: 5 %
HCT: 31.9 % — ABNORMAL LOW (ref 36.0–46.0)
Hemoglobin: 10.4 g/dL — ABNORMAL LOW (ref 12.0–15.0)
Lymphocytes Relative: 45 %
Lymphs Abs: 1.6 10*3/uL (ref 0.7–4.0)
MCH: 27.6 pg (ref 26.0–34.0)
MCHC: 32.6 g/dL (ref 30.0–36.0)
MCV: 84.6 fL (ref 78.0–100.0)
MONOS PCT: 8 %
Monocytes Absolute: 0.3 10*3/uL (ref 0.1–1.0)
NEUTROS ABS: 1.5 10*3/uL — AB (ref 1.7–7.7)
Neutrophils Relative %: 42 %
PLATELETS: 126 10*3/uL — AB (ref 150–400)
RBC: 3.77 MIL/uL — ABNORMAL LOW (ref 3.87–5.11)
RDW: 13.6 % (ref 11.5–15.5)
WBC: 3.6 10*3/uL — ABNORMAL LOW (ref 4.0–10.5)

## 2017-06-08 LAB — PROTIME-INR
INR: 1.05
PROTHROMBIN TIME: 13.7 s (ref 11.4–15.2)

## 2017-06-08 LAB — HEPATIC FUNCTION PANEL
ALBUMIN: 2.9 g/dL — AB (ref 3.5–5.0)
ALT: 89 U/L — ABNORMAL HIGH (ref 14–54)
AST: 56 U/L — AB (ref 15–41)
Alkaline Phosphatase: 36 U/L — ABNORMAL LOW (ref 38–126)
BILIRUBIN TOTAL: 0.5 mg/dL (ref 0.3–1.2)
Bilirubin, Direct: <0.1 mg/dL — ABNORMAL LOW (ref 0.1–0.5)
TOTAL PROTEIN: 5.5 g/dL — AB (ref 6.5–8.1)

## 2017-06-08 LAB — MAGNESIUM: MAGNESIUM: 1.8 mg/dL (ref 1.7–2.4)

## 2017-06-08 LAB — TROPONIN I

## 2017-06-08 LAB — PHOSPHORUS: PHOSPHORUS: 3.7 mg/dL (ref 2.5–4.6)

## 2017-06-08 MED ORDER — MAGNESIUM SULFATE 2 GM/50ML IV SOLN
2.0000 g | Freq: Once | INTRAVENOUS | Status: DC
  Filled 2017-06-08: qty 50

## 2017-06-08 MED ORDER — ADULT MULTIVITAMIN W/MINERALS CH
1.0000 | ORAL_TABLET | Freq: Every day | ORAL | Status: DC
  Administered 2017-06-08 – 2017-06-10 (×3): 1 via ORAL
  Filled 2017-06-08 (×3): qty 1

## 2017-06-08 MED ORDER — LORAZEPAM 1 MG PO TABS
1.0000 mg | ORAL_TABLET | Freq: Four times a day (QID) | ORAL | Status: DC | PRN
  Administered 2017-06-08: 1 mg via ORAL
  Filled 2017-06-08: qty 1

## 2017-06-08 MED ORDER — GABAPENTIN 300 MG PO CAPS
300.0000 mg | ORAL_CAPSULE | Freq: Two times a day (BID) | ORAL | Status: DC
  Administered 2017-06-08 – 2017-06-10 (×5): 300 mg via ORAL
  Filled 2017-06-08 (×5): qty 1

## 2017-06-08 MED ORDER — HALOPERIDOL LACTATE 5 MG/ML IJ SOLN
5.0000 mg | INTRAMUSCULAR | Status: AC
  Administered 2017-06-08: 5 mg via INTRAVENOUS

## 2017-06-08 MED ORDER — LORAZEPAM 1 MG PO TABS
1.0000 mg | ORAL_TABLET | Freq: Three times a day (TID) | ORAL | Status: AC
  Administered 2017-06-09 – 2017-06-10 (×3): 1 mg via ORAL
  Filled 2017-06-08 (×3): qty 1

## 2017-06-08 MED ORDER — LOPERAMIDE HCL 2 MG PO CAPS: 2.0000 mg | ORAL_CAPSULE | ORAL | Status: DC | PRN

## 2017-06-08 MED ORDER — DEXTROSE-NACL 5-0.45 % IV SOLN
INTRAVENOUS | Status: DC
  Administered 2017-06-08: 13:00:00 via INTRAVENOUS

## 2017-06-08 MED ORDER — THIAMINE HCL 100 MG/ML IJ SOLN
100.0000 mg | Freq: Once | INTRAMUSCULAR | Status: DC
  Filled 2017-06-08: qty 2

## 2017-06-08 MED ORDER — VITAMIN B-1 100 MG PO TABS
100.0000 mg | ORAL_TABLET | Freq: Every day | ORAL | Status: DC
  Administered 2017-06-08 – 2017-06-10 (×3): 100 mg via ORAL
  Filled 2017-06-08 (×3): qty 1

## 2017-06-08 MED ORDER — DEXMEDETOMIDINE HCL IN NACL 400 MCG/100ML IV SOLN
0.4000 ug/kg/h | INTRAVENOUS | Status: DC
  Administered 2017-06-08: 0.5 ug/kg/h via INTRAVENOUS
  Filled 2017-06-08 (×2): qty 100

## 2017-06-08 MED ORDER — LORAZEPAM 1 MG PO TABS
1.0000 mg | ORAL_TABLET | Freq: Four times a day (QID) | ORAL | Status: AC
  Administered 2017-06-08 – 2017-06-09 (×4): 1 mg via ORAL
  Filled 2017-06-08 (×4): qty 1

## 2017-06-08 MED ORDER — VITAMIN B-1 100 MG PO TABS: 100.0000 mg | ORAL_TABLET | Freq: Every day | ORAL | Status: DC

## 2017-06-08 MED ORDER — GABAPENTIN 600 MG PO TABS
300.0000 mg | ORAL_TABLET | Freq: Two times a day (BID) | ORAL | Status: DC
  Filled 2017-06-08: qty 0.5

## 2017-06-08 MED ORDER — HALOPERIDOL LACTATE 5 MG/ML IJ SOLN
INTRAMUSCULAR | Status: AC
  Filled 2017-06-08: qty 1

## 2017-06-08 MED ORDER — MAGNESIUM OXIDE 400 (241.3 MG) MG PO TABS
800.0000 mg | ORAL_TABLET | Freq: Two times a day (BID) | ORAL | Status: AC
  Administered 2017-06-08 – 2017-06-09 (×2): 800 mg via ORAL
  Filled 2017-06-08 (×2): qty 2

## 2017-06-08 MED ORDER — LORAZEPAM 1 MG PO TABS
1.0000 mg | ORAL_TABLET | Freq: Two times a day (BID) | ORAL | Status: DC
  Administered 2017-06-10: 1 mg via ORAL
  Filled 2017-06-08: qty 1

## 2017-06-08 MED ORDER — LORAZEPAM 1 MG PO TABS: 1.0000 mg | ORAL_TABLET | Freq: Every day | ORAL | Status: DC

## 2017-06-08 MED ORDER — HYDROXYZINE HCL 25 MG PO TABS: 25.0000 mg | ORAL_TABLET | Freq: Four times a day (QID) | ORAL | Status: DC | PRN

## 2017-06-08 MED ORDER — AMMONIA AROMATIC IN INHA
0.3000 mL | Freq: Once | RESPIRATORY_TRACT | Status: AC
  Administered 2017-06-08: 0.3 mL via RESPIRATORY_TRACT
  Filled 2017-06-08: qty 20

## 2017-06-08 MED ORDER — FENTANYL CITRATE (PF) 100 MCG/2ML IJ SOLN
25.0000 ug | Freq: Once | INTRAMUSCULAR | Status: AC
  Administered 2017-06-08: 25 ug via INTRAVENOUS

## 2017-06-08 MED ORDER — ONDANSETRON 4 MG PO TBDP: 4.0000 mg | ORAL_TABLET | Freq: Four times a day (QID) | ORAL | Status: DC | PRN

## 2017-06-08 NOTE — Consult Note (Signed)
[place] Psychiatry Consult   Reason for Consult:  Psychosis, Seizures Referring Physician:  Dr. Lamonte Sakai Patient Identification: Evoleht Hovatter MRN:  [id number] Principal Diagnosis: Polysubstance (including opioids) dependence with physiol dependence [facility]) Diagnosis:   Patient Active Problem List   Diagnosis Date Noted  . Polysubstance (including opioids) dependence with physiol dependence ([place]) [F19.20] 06/08/2017  . Abdominal pain [R10.9] 06/06/2017    Total Time spent with patient: 45 minutes  Subjective:   Adison Reifsteck is a 21 y.o. female patient admitted with abdominal pain.  HPI:  Patient who reports history of Polysubstance dependence and Chronic abdominal pain for which she had lap chole at [facility] in 2016. She states that she was brought to this hospital due to severe abdominal pain. However, during this admission patient reportedly had multiple seizure-like episodes which  Neurologist thinks are non-epileptic. Patient reports that she has been going through some stress in the past few months, her fiance died from drug overdose few months ago. Patient states that she was admitted to [facility] for Opioid abuse and treated in 2017 but did not follow up with after care. She has continued to use multiple illicit drugs. On admission, her urine toxicology is positive for  Opiate, Cocaine, THC and Benzo. Currently, patient denies SI/HI, depression, psychosis or delusional thinking. She states that her main concern is the abdominal pain.   Past Psychiatric History: as above  Risk to Self: Is patient at risk for suicide?: No Risk to Others:   Prior Inpatient Therapy:   Prior Outpatient Therapy:    Past Medical History:  Past Medical History:  Diagnosis Date  . Ileus [facility])     Past Surgical History:  Procedure Laterality Date  . APPENDECTOMY    . CHOLECYSTECTOMY     Family History: History reviewed. No pertinent family history. Family Psychiatric  History:   Social History:  History  Alcohol Use No     History  Drug Use No    Social History   Social History  . Marital status: Single    Spouse name: N/A  . Number of children: N/A  . Years of education: N/A   Social History Main Topics  . Smoking status: Current Every Day Smoker    Packs/day: 0.50    Types: Cigarettes  . Smokeless tobacco: Never Used  . Alcohol use No  . Drug use: No  . Sexual activity: Not Asked   Other Topics Concern  . None   Social History Narrative  . None   Additional Social History:    Allergies:  No Known Allergies  Labs:  Results for orders placed or performed during the hospital encounter of 06/06/17 (from the past 48 hour(s))  Urinalysis, Routine w reflex microscopic     Status: Abnormal   Collection Time: 06/06/17  3:30 PM  Result Value Ref Range   Color, Urine YELLOW YELLOW   APPearance HAZY (A) CLEAR   Specific Gravity, Urine 1.023 1.005 - 1.030   pH 5.0 5.0 - 8.0   Glucose, UA NEGATIVE NEGATIVE mg/dL   Hgb urine dipstick NEGATIVE NEGATIVE   Bilirubin Urine NEGATIVE NEGATIVE   Ketones, ur 20 (A) NEGATIVE mg/dL   Protein, ur NEGATIVE NEGATIVE mg/dL   Nitrite NEGATIVE NEGATIVE   Leukocytes, UA NEGATIVE NEGATIVE  Urine rapid drug screen (hosp performed)     Status: Abnormal   Collection Time: 06/06/17  3:30 PM  Result Value Ref Range   Opiates POSITIVE (A) NONE DETECTED   Cocaine POSITIVE (A)  NONE DETECTED   Benzodiazepines POSITIVE (A) NONE DETECTED   Amphetamines NONE DETECTED NONE DETECTED   Tetrahydrocannabinol POSITIVE (A) NONE DETECTED   Barbiturates NONE DETECTED NONE DETECTED    Comment:        DRUG SCREEN FOR MEDICAL PURPOSES ONLY.  IF CONFIRMATION IS NEEDED FOR ANY PURPOSE, NOTIFY LAB WITHIN 5 DAYS.        LOWEST DETECTABLE LIMITS FOR URINE DRUG SCREEN Drug Class       Cutoff (ng/mL) Amphetamine      1000 Barbiturate      200 Benzodiazepine   025 Tricyclics       852 Opiates          300 Cocaine           300 THC              50   Acetaminophen level     Status: Abnormal   Collection Time: 06/06/17  5:01 PM  Result Value Ref Range   Acetaminophen (Tylenol), Serum <10 (L) 10 - 30 ug/mL    Comment:        THERAPEUTIC CONCENTRATIONS VARY SIGNIFICANTLY. A RANGE OF 10-30 ug/mL MAY BE AN EFFECTIVE CONCENTRATION FOR MANY PATIENTS. HOWEVER, SOME ARE BEST TREATED AT CONCENTRATIONS OUTSIDE THIS RANGE. ACETAMINOPHEN CONCENTRATIONS >150 ug/mL AT 4 HOURS AFTER INGESTION AND >50 ug/mL AT 12 HOURS AFTER INGESTION ARE OFTEN ASSOCIATED WITH TOXIC REACTIONS.   Salicylate level     Status: None   Collection Time: 06/06/17  5:01 PM  Result Value Ref Range   Salicylate Lvl <7.7 2.8 - 30.0 mg/dL  MRSA PCR Screening     Status: Abnormal   Collection Time: 06/06/17  8:50 PM  Result Value Ref Range   MRSA by PCR POSITIVE (A) NEGATIVE    Comment:        The GeneXpert MRSA Assay (FDA approved for NASAL specimens only), is one component of a comprehensive MRSA colonization surveillance program. It is not intended to diagnose MRSA infection nor to guide or monitor treatment for MRSA infections. RESULT CALLED TO, READ BACK BY AND VERIFIED WITH: B SCOTT AT 2311 ON 08.03.2018 BY NBROOKS   I-STAT 3, arterial blood gas (G3+)     Status: None   Collection Time: 06/07/17 12:14 PM  Result Value Ref Range   pH, Arterial 7.449 7.350 - 7.450   pCO2 arterial 32.3 32.0 - 48.0 mmHg   pO2, Arterial 94.0 83.0 - 108.0 mmHg   Bicarbonate 22.3 20.0 - 28.0 mmol/L   TCO2 23 0 - 100 mmol/L   O2 Saturation 98.0 %   Acid-base deficit 1.0 0.0 - 2.0 mmol/L   Patient temperature 99.7 F    Collection site RADIAL, ALLEN'S TEST ACCEPTABLE    Drawn by Operator    Sample type ARTERIAL   Lactic acid, plasma     Status: None   Collection Time: 06/07/17 12:19 PM  Result Value Ref Range   Lactic Acid, Venous 0.9 0.5 - 1.9 mmol/L  CK total and CKMB (cardiac)not at [facility]     Status: None   Collection Time: 06/07/17 12:27  PM  Result Value Ref Range   Total CK 63 38 - 234 U/L   CK, MB 0.8 0.5 - 5.0 ng/mL   Relative Index RELATIVE INDEX IS INVALID 0.0 - 2.5    Comment: WHEN CK < 100 U/L          Hepatic function panel     Status: Abnormal   Collection  Time: 06/07/17 12:27 PM  Result Value Ref Range   Total Protein 6.1 (L) 6.5 - 8.1 g/dL   Albumin 3.3 (L) 3.5 - 5.0 g/dL   AST 62 (H) 15 - 41 U/L   ALT 99 (H) 14 - 54 U/L   Alkaline Phosphatase 41 38 - 126 U/L   Total Bilirubin 0.8 0.3 - 1.2 mg/dL   Bilirubin, Direct 0.2 0.1 - 0.5 mg/dL   Indirect Bilirubin 0.6 0.3 - 0.9 mg/dL  Lipase, blood     Status: None   Collection Time: 06/07/17 12:27 PM  Result Value Ref Range   Lipase 30 11 - 51 U/L  Troponin I     Status: None   Collection Time: 06/07/17 12:27 PM  Result Value Ref Range   Troponin I <0.03 <0.03 ng/mL  Procalcitonin - Baseline     Status: None   Collection Time: 06/07/17 12:27 PM  Result Value Ref Range   Procalcitonin <0.10 ng/mL    Comment:        Interpretation: PCT (Procalcitonin) <= 0.5 ng/mL: Systemic infection (sepsis) is not likely. Local bacterial infection is possible. (NOTE)         ICU PCT Algorithm               Non ICU PCT Algorithm    ----------------------------     ------------------------------         PCT < 0.25 ng/mL                 PCT < 0.1 ng/mL     Stopping of antibiotics            Stopping of antibiotics       strongly encouraged.               strongly encouraged.    ----------------------------     ------------------------------       PCT level decrease by               PCT < 0.25 ng/mL       >= 80% from peak PCT       OR PCT 0.25 - 0.5 ng/mL          Stopping of antibiotics                                             encouraged.     Stopping of antibiotics           encouraged.    ----------------------------     ------------------------------       PCT level decrease by              PCT >= 0.25 ng/mL       < 80% from peak PCT        AND PCT >= 0.5  ng/mL            Continuin g antibiotics                                              encouraged.       Continuing antibiotics            encouraged.    ----------------------------     ------------------------------     PCT level  increase compared          PCT > 0.5 ng/mL         with peak PCT AND          PCT >= 0.5 ng/mL             Escalation of antibiotics                                          strongly encouraged.      Escalation of antibiotics        strongly encouraged.   Basic metabolic panel     Status: Abnormal   Collection Time: 06/07/17 12:27 PM  Result Value Ref Range   Sodium 140 135 - 145 mmol/L   Potassium 3.7 3.5 - 5.1 mmol/L   Chloride 110 101 - 111 mmol/L   CO2 21 (L) 22 - 32 mmol/L   Glucose, Bld 86 65 - 99 mg/dL   BUN 8 6 - 20 mg/dL   Creatinine, Ser 0.65 0.44 - 1.00 mg/dL   Calcium 8.3 (L) 8.9 - 10.3 mg/dL   GFR calc non Af Amer >60 >60 mL/min   GFR calc Af Amer >60 >60 mL/min    Comment: (NOTE) The eGFR has been calculated using the CKD EPI equation. This calculation has not been validated in all clinical situations. eGFR's persistently <60 mL/min signify possible Chronic Kidney Disease.    Anion gap 9 5 - 15  Lactic acid, plasma     Status: None   Collection Time: 06/07/17 10:45 PM  Result Value Ref Range   Lactic Acid, Venous 0.7 0.5 - 1.9 mmol/L  Basic metabolic panel     Status: Abnormal   Collection Time: 06/07/17 10:45 PM  Result Value Ref Range   Sodium 139 135 - 145 mmol/L   Potassium 3.6 3.5 - 5.1 mmol/L   Chloride 111 101 - 111 mmol/L   CO2 24 22 - 32 mmol/L   Glucose, Bld 98 65 - 99 mg/dL   BUN 7 6 - 20 mg/dL   Creatinine, Ser 0.68 0.44 - 1.00 mg/dL   Calcium 8.3 (L) 8.9 - 10.3 mg/dL   GFR calc non Af Amer >60 >60 mL/min   GFR calc Af Amer >60 >60 mL/min    Comment: (NOTE) The eGFR has been calculated using the CKD EPI equation. This calculation has not been validated in all clinical situations. eGFR's persistently <60 mL/min  signify possible Chronic Kidney Disease.    Anion gap 4 (L) 5 - 15  Magnesium     Status: None   Collection Time: 06/08/17  6:33 AM  Result Value Ref Range   Magnesium 1.8 1.7 - 2.4 mg/dL  Phosphorus     Status: None   Collection Time: 06/08/17  6:33 AM  Result Value Ref Range   Phosphorus 3.7 2.5 - 4.6 mg/dL  CBC with Differential/Platelet     Status: Abnormal   Collection Time: 06/08/17  6:33 AM  Result Value Ref Range   WBC 3.6 (L) 4.0 - 10.5 K/uL   RBC 3.77 (L) 3.87 - 5.11 MIL/uL   Hemoglobin 10.4 (L) 12.0 - 15.0 g/dL   HCT 31.9 (L) 36.0 - 46.0 %   MCV 84.6 78.0 - 100.0 fL   MCH 27.6 26.0 - 34.0 pg   MCHC 32.6 30.0 - 36.0 g/dL   RDW 13.6 11.5 -  15.5 %   Platelets 126 (L) 150 - 400 K/uL   Neutrophils Relative % 42 %   Neutro Abs 1.5 (L) 1.7 - 7.7 K/uL   Lymphocytes Relative 45 %   Lymphs Abs 1.6 0.7 - 4.0 K/uL   Monocytes Relative 8 %   Monocytes Absolute 0.3 0.1 - 1.0 K/uL   Eosinophils Relative 5 %   Eosinophils Absolute 0.2 0.0 - 0.7 K/uL   Basophils Relative 0 %   Basophils Absolute 0.0 0.0 - 0.1 K/uL  Hepatic function panel     Status: Abnormal   Collection Time: 06/08/17  6:33 AM  Result Value Ref Range   Total Protein 5.5 (L) 6.5 - 8.1 g/dL   Albumin 2.9 (L) 3.5 - 5.0 g/dL   AST 56 (H) 15 - 41 U/L   ALT 89 (H) 14 - 54 U/L   Alkaline Phosphatase 36 (L) 38 - 126 U/L   Total Bilirubin 0.5 0.3 - 1.2 mg/dL   Bilirubin, Direct <0.1 (L) 0.1 - 0.5 mg/dL   Indirect Bilirubin NOT CALCULATED 0.3 - 0.9 mg/dL  Protime-INR     Status: None   Collection Time: 06/08/17  6:33 AM  Result Value Ref Range   Prothrombin Time 13.7 11.4 - 15.2 seconds   INR 1.05   Troponin I     Status: None   Collection Time: 06/08/17  6:33 AM  Result Value Ref Range   Troponin I <0.03 <0.03 ng/mL  Procalcitonin     Status: None   Collection Time: 06/08/17  6:33 AM  Result Value Ref Range   Procalcitonin <0.10 ng/mL    Comment:        Interpretation: PCT (Procalcitonin) <= 0.5  ng/mL: Systemic infection (sepsis) is not likely. Local bacterial infection is possible. (NOTE)         ICU PCT Algorithm               Non ICU PCT Algorithm    ----------------------------     ------------------------------         PCT < 0.25 ng/mL                 PCT < 0.1 ng/mL     Stopping of antibiotics            Stopping of antibiotics       strongly encouraged.               strongly encouraged.    ----------------------------     ------------------------------       PCT level decrease by               PCT < 0.25 ng/mL       >= 80% from peak PCT       OR PCT 0.25 - 0.5 ng/mL          Stopping of antibiotics                                             encouraged.     Stopping of antibiotics           encouraged.    ----------------------------     ------------------------------       PCT level decrease by              PCT >= 0.25 ng/mL       <  80% from peak PCT        AND PCT >= 0.5 ng/mL            Continuin g antibiotics                                              encouraged.       Continuing antibiotics            encouraged.    ----------------------------     ------------------------------     PCT level increase compared          PCT > 0.5 ng/mL         with peak PCT AND          PCT >= 0.5 ng/mL             Escalation of antibiotics                                          strongly encouraged.      Escalation of antibiotics        strongly encouraged.     Current Facility-Administered Medications  Medication Dose Route Frequency Provider Last Rate Last Dose  . 0.9 %  sodium chloride infusion  250 mL Intravenous PRN Ollis, Brandi L, NP 10 mL/hr at 06/07/17 1600 250 mL at 06/07/17 1600  . dextrose 5 %-0.45 % sodium chloride infusion   Intravenous Continuous Brand Males, MD 10 mL/hr at 06/08/17 1320    . famotidine (PEPCID) tablet 20 mg  20 mg Oral BID Ollis, Brandi L, NP   20 mg at 06/08/17 1039  . fentaNYL (SUBLIMAZE) injection 50-100 mcg  50-100 mcg  Intravenous Q4H PRN Brand Males, MD   100 mcg at 06/08/17 1039  . gabapentin (NEURONTIN) tablet 300 mg  300 mg Oral BID Dereona Kolodny, MD      . heparin injection 5,000 Units  5,000 Units Subcutaneous Q8H Ollis, Brandi L, NP   5,000 Units at 06/08/17 1410  . hydrOXYzine (ATARAX/VISTARIL) tablet 25 mg  25 mg Oral Q6H PRN Shylo Zamor, MD      . loperamide (IMODIUM) capsule 2-4 mg  2-4 mg Oral PRN Flora Ratz, MD      . LORazepam (ATIVAN) tablet 1 mg  1 mg Oral Q6H PRN Shon Indelicato, MD      . LORazepam (ATIVAN) tablet 1 mg  1 mg Oral QID Corena Pilgrim, MD       Followed by  . [START ON 06/09/2017] LORazepam (ATIVAN) tablet 1 mg  1 mg Oral TID Corena Pilgrim, MD       Followed by  . [START ON 06/10/2017] LORazepam (ATIVAN) tablet 1 mg  1 mg Oral BID Corena Pilgrim, MD       Followed by  . [START ON 06/12/2017] LORazepam (ATIVAN) tablet 1 mg  1 mg Oral Daily Kadarius Cuffe, MD      . magnesium sulfate IVPB 2 g 50 mL  2 g Intravenous Once Brand Males, MD   Stopped at 06/08/17 1510  . metroNIDAZOLE (FLAGYL) 50 mg/ml oral suspension 500 mg  500 mg Per Tube BID Ollis, Brandi L, NP   500 mg at 06/08/17 1040  . multivitamin with minerals tablet 1 tablet  1 tablet Oral Daily  Corena Pilgrim, MD      . ondansetron (ZOFRAN-ODT) disintegrating tablet 4 mg  4 mg Oral Q6H PRN Elnor Renovato, MD      . thiamine (B-1) injection 100 mg  100 mg Intramuscular Once Kamira Mellette, MD      . Derrill Memo ON 06/09/2017] thiamine (VITAMIN B-1) tablet 100 mg  100 mg Oral Daily Madeline Pho, MD        Musculoskeletal: Strength & Muscle Tone: within normal limits Gait & Station: unsteady Patient leans: N/A  Psychiatric Specialty Exam: Physical Exam  ROS  Blood pressure 97/67, pulse 71, temperature 98.9 F (37.2 C), temperature source Axillary, resp. rate 16, height 5' 2"  (1.575 m), weight 60.6 kg (133 lb 9.6 oz), last menstrual period 05/30/2017, SpO2 98 %.Body mass index is 24.44  kg/m.  General Appearance: Casual  Eye Contact:  Minimal  Speech:  Clear and Coherent and Slow  Volume:  Decreased  Mood:  Anxious  Affect:  Constricted  Thought Process:  Coherent and Descriptions of Associations: Intact  Orientation:  Full (Time, Place, and Person)  Thought Content:  Logical  Suicidal Thoughts:  No  Homicidal Thoughts:  No  Memory:  Immediate;   Fair Recent;   Fair Remote;   Fair  Judgement:  Intact  Insight:  Fair  Psychomotor Activity:  Decreased  Concentration:  Concentration: Fair and Attention Span: Fair  Recall:  [employer] of Knowledge:  Fair  Language:  Good  Akathisia:  No  Handed:  Right  AIMS (if indicated):     Assets:  Communication Skills Desire for Improvement  ADL's:  Intact  Cognition:  WNL  Sleep:   fair     Treatment Plan Summary: Assessment/Recommendations: -Patient with history of Depression and Polysubstance abuse who presents with abdominal pain and non-epileptic seizure like episodes. Patient currently denies any acute psychiatric symptoms and as such does not meet criteria for psychiatric inpatient admission.  -May benefit from GI consult/Evaluation in the context of severe right sided abdominal pain with elevated liver enzymes. -Lorazepam detox protocol -Opioid detox protocol  -Gabapentin 300 mg bid for cocaine abuse and agitation. -Re-consult psychiatric service if necessary in future.   Disposition: No evidence of imminent risk to self or others at present.   Patient does not meet criteria for psychiatric inpatient admission. Supportive therapy provided about ongoing stressors. Unit Social worker to assist with referring patient to substance abuse counseling when medically stable and discharge.  Corena Pilgrim, MD 06/08/2017 2:19 PM

## 2017-06-08 NOTE — Progress Notes (Signed)
PULMONARY / CRITICAL CARE MEDICINE   Name: Connie Mckay MRN: [id number] DOB: 1996-03-06    ADMISSION DATE:  06/06/2017 CONSULTATION DATE:  06/06/2017  REFERRING MD:  Dr. Rhunette Croft  CHIEF COMPLAINT:  LLQ pain  breif  21 year old female with little known past medical history, possibly including history of ileus who presented to [facility] ED with complaints of LLQ pain.      Report, the patient and boyfriend were driving through [facility] when her abdominal pain became worse. She reportedly takes amoxicillin and Dilaudid Q4 for her abdominal pain.  Her boyfriend reports she used to drink alcohol but quit approximately 5 months ago. She also reports having elevated liver enzymes in the past.   Per triage notes patient stated she forgot her medications for her ileus and liver problems.  On EMR review, there is no previous visits or records in care everywhere.  At some point during the ER visit, she refused her CT scan of her abdomen due to nausea.  Labs remarkable for AST 90, ALT 150, glucose 117, with clue cells and many WBC on wet prep; hCG negative.  She was afebrile, normal WBC, and normal vital signs until she was witness to have a "staring spell" where she was not responding but able to hold her arm up to prevent gravitational fall into her face. Staff reports she also was attempting to make herself vomit due to pain and nausea. She had one episode of small volume bilous emesis.  She later was noted to have generalized "seizure" activity.  During the seizure episode, she was able to stop the RN from placing an IV in her left hand and instruct her to place it in her right hand. She also responded to ammonia during seizure activity.  She was treated with ativan and then intubated for airway protection.  Subsequently loaded with Keppra.  CT of abdomen & head pending.    EVENTS 06/06/2017 - admit ccm notes 21 yo woman with hx chronic abd pain, narcotics use, hepatitis, also apparent illicit drug use  based on UDS and her mother's report. She was travelling across the state, had severe LLQ pain and went to [facility] ED for eval;. In the ED she had episode of unresponsiveness and then generalized movement that was concerning for seizure. There were some characteristics that were inconsistent with true seizure activity. 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 CT abdomen was normal. UDS confirmed cocaine, THC, narcotics and benzos (she received ativan in ED). On my eval she is sedated, grimaces to pain. ETT in good position. Lungs clear B. Heart regular without a M, abdomen benign, no edema. Unclear to me that this was true seizure vs pseudoseizures. Her mother has told me by phone that she is concerned about what substances her daughter has been given or even restrained and forced to take. She has asked me not to discuss these concerns with the patient's boyfriend Judie Petit or his mother who are here at bedside. Clearly mom and patient will need to communicate these issues to our social workers when able to do so. We will help facilitate this. Neurology has recommended that we continue keppra for now, check EEG. I will work to try to extubate as soon as we can get her waked up.    .   STUDIES:  CT Abd 8/3 >> Head CT >>  CULTURES: 8/3 Wet prep >> clue cells, many WBCs 8/3 GC Probe >>   ANTIBIOTICS: Flagyl 8/3 >>  SIGNIFICANT EVENTS: 8/3 Admitted, seizure, intubated  LINES/TUBES: PIV  8/3 ETT >>  ? 8/3  8/4 - neuro notes reviewed. 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 eeeg pending. In itnerim, repeated interim "seizures" with return to normalcy even before RN could get ativan. Remains extubated. Family at bedside. RN concerned about opioid withdrawal    SUBJECTIVE/OVERNIGHT/INTERVAL HX 06/08/17 - EEG normal and neuro has indicaed to Dr Delton CoombesByrum yesterday that patient has pseudoseizures . Slept well last night. This AM - repeated pseudoseizure jerks. Initially responded to inhaled NH3 but not t  hat much. Psych was called yesterday by Dr Delton CoombesByrum and Dr Quenten RavenM Akintyo has indicated to RN 06/08/2017 that he is comnig today. [facility]. 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 - does not need to be addressed inpatient  Patient mom at bedside: wants [place] and also reports court date for patient on 06/11/17   VITAL SIGNS: BP (!) 95/52   Pulse (!) 115   Temp 98.9 F (37.2 C) (Axillary)   Resp (!) 25   Ht 5\' 2"  (1.575 m)   Wt 60.6 kg (133 lb 9.6 oz)   LMP 05/30/2017 (Approximate) Comment: pt intubated   SpO2 99%   BMI 24.44 kg/m   HEMODYNAMICS:    VENTILATOR SETTINGS:    INTAKE / OUTPUT: I/O last 3 completed shifts: In: 1547.2 [P.O.:240; I.V.:1199.7; IV Piggyback:107.5] Out: 2051 [Urine:2050; Emesis/NG output:1]  PHYSICAL EXAMINATION:   General Appearance:    intermittenly awake and conversant with tears and sad. Intermittently jerking  Head:    Normocephalic, without obvious abnormality, atraumatic  Eyes:    PERRL - yes, conjunctiva/corneas - clear      Ears:    Normal external ear canals, both ears  Nose:   NG tube - no  Throat:  ETT TUBE - no , OG tube - no  Neck:   Supple,  No enlargement/tenderness/nodules     Lungs:     Clear to auscultation bilaterally,   Chest wall:    No deformity  Heart:    S1 and S2 normal, no murmur, CVP - no.  Pressors - no  Abdomen:     Soft, no masses, no organomegaly  Genitalia:    Not done  Rectal:   not done  Extremities:   Extremities- intact     Skin:   Intact in exposed areas .      Neurologic:   Oriented x 3 and sad OR Jerking      LABS:  PULMONARY  Recent Labs Lab 06/07/17 1214  PHART 7.449  PCO2ART 32.3  PO2ART 94.0  HCO3 22.3  TCO2 23  O2SAT 98.0    CBC  Recent Labs Lab 06/06/17 1156 06/08/17 0633  HGB 14.1 10.4*  HCT 41.0 31.9*  WBC 10.1 3.6*  PLT 244 126*    COAGULATION  Recent Labs Lab 06/08/17 0633  INR 1.05    CARDIAC    Recent  Labs Lab 06/07/17 1227 06/08/17 0633  TROPONINI <0.03 <0.03   No results for input(s): PROBNP in the last 168 hours.   CHEMISTRY  Recent Labs Lab 06/06/17 1156 06/07/17 1227 06/07/17 2245 06/08/17 0633  NA 139 140 139  --   K 4.1 3.7 3.6  --   CL 105 110 111  --   CO2 24 21* 24  --   GLUCOSE 117* 86 98  --   BUN 12 8 7   --   CREATININE 0.71 0.65 0.68  --  CALCIUM 9.9 8.3* 8.3*  --   MG 2.0  --   --  1.8  PHOS  --   --   --  3.7   Estimated Creatinine Clearance: 96.2 mL/min (by C-G formula based on SCr of 0.68 mg/dL).   LIVER  Recent Labs Lab 06/06/17 1156 06/07/17 1227 06/08/17 0633  AST 90* 62* 56*  ALT 150* 99* 89*  ALKPHOS 57 41 36*  BILITOT 0.3 0.8 0.5  PROT 8.5* 6.1* 5.5*  ALBUMIN 4.6 3.3* 2.9*  INR  --   --  1.05     INFECTIOUS  Recent Labs Lab 06/07/17 1219 06/07/17 1227 06/07/17 2245 06/08/17 0633  LATICACIDVEN 0.9  --  0.7  --   PROCALCITON  --  <0.10  --  <0.10     ENDOCRINE CBG (last 3)   Recent Labs  06/06/17 1304  GLUCAP 120*         IMAGING x48h  - image(s) personally visualized  -   highlighted in bold Ct Head Wo Contrast  Result Date: 06/06/2017 CLINICAL DATA:  21 y/o F; new seizure. Now intubated. Initial examination. EXAM: CT HEAD WITHOUT CONTRAST TECHNIQUE: Contiguous axial images were obtained from the base of the skull through the vertex without intravenous contrast. COMPARISON:  None. FINDINGS: Brain: No evidence of acute infarct, intracranial hemorrhage, hydrocephalus, focal mass effect of the brain parenchyma. There is a prominent left middle cranial fossa extra-axial space measuring 28 x 30 x 22 mm (AP x ML x CC series 2, image 6 and series 4, image 28) probably representing an arachnoid cyst. The structure exerts mild mass effect on the left anteromedial temporal lobe. Vascular: No hyperdense vessel or unexpected calcification. Skull: Normal. Negative for fracture or focal lesion. Sinuses/Orbits: No acute finding.  Other: None. IMPRESSION: 1. No acute intracranial abnormality identified. 2. 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 The structure exerts mild mass effect on the left anteromedial temporal lobe. Consider MRI of the brain with and without contrast for further characterization Electronically Signed   By: Mitzi Hansen M.D.   On: 06/06/2017 16:29   Ct Abdomen Pelvis W Contrast  Result Date: 06/06/2017 CLINICAL DATA:  Left lower quadrant pain, nausea, and vomiting. Seizure and subsequent intubation. EXAM: CT ABDOMEN AND PELVIS WITH CONTRAST TECHNIQUE: Multidetector CT imaging of the abdomen and pelvis was performed using the standard protocol following bolus administration of intravenous contrast. CONTRAST:  ISOVUE-300 IOPAMIDOL (ISOVUE-300) INJECTION 61% COMPARISON:  None. FINDINGS: Lower chest: Minimal atelectasis in the lung bases. No pleural effusion. Normal heart size. Hepatobiliary: No focal liver abnormality is seen. Status post cholecystectomy. No biliary dilatation. Pancreas: Unremarkable. Spleen: Unremarkable. Adrenals/Urinary Tract: Unremarkable adrenal glands. No evidence of renal mass, calculi, or hydronephrosis. Foley catheter and small volume fluid in the bladder. Small clip or other metallic device just superior to the bladder in the pelvis. Stomach/Bowel: Enteric tube and small volume fluid in the stomach. Mild gaseous distention of the stomach. No evidence of bowel obstruction. The small and large bowel are largely decompressed, limiting evaluation for wall thickening. Prior appendectomy. Vascular/Lymphatic: No significant vascular findings are present. No enlarged abdominal or pelvic lymph nodes. Reproductive: Uterus and bilateral adnexa are unremarkable. Other: At most trace free fluid in the pelvis. No pneumoperitoneum. No abdominal wall mass or hernia. Musculoskeletal: No acute osseous abnormality or suspicious  osseous lesion. IMPRESSION: No acute abnormality identified in the abdomen or pelvis. Electronically Signed   By:  Sebastian Ache M.D.   On: 06/06/2017 16:47   Dg Chest Port 1 View  Result Date: 06/08/2017 CLINICAL DATA:  Seizure. EXAM: PORTABLE CHEST 1 VIEW COMPARISON:  06/07/2017 FINDINGS: Both lungs are clear. Heart and mediastinum are within normal limits. Negative for a pneumothorax. Trachea is midline. Bony thorax is intact. Mild curvature in the spine may be related to patient positioning. IMPRESSION: No active disease. Electronically Signed   By: Richarda Overlie M.D.   On: 06/08/2017 12:59   Dg Chest Port 1 View  Result Date: 06/07/2017 CLINICAL DATA:  21 year old female now extubated EXAM: PORTABLE CHEST 1 VIEW COMPARISON:  Prior chest x-ray 06/06/2017 FINDINGS: The lungs are clear and negative for focal airspace consolidation, pulmonary edema or suspicious pulmonary nodule. No pleural effusion or pneumothorax. Cardiac and mediastinal contours are within normal limits. No acute fracture or lytic or blastic osseous lesions. The visualized upper abdominal bowel gas pattern is unremarkable. IMPRESSION: Negative chest x-ray. Electronically Signed   By: Malachy Moan M.D.   On: 06/07/2017 09:12   Dg Chest Port 1 View  Result Date: 06/06/2017 CLINICAL DATA:  Status post intubation and OG tube placement today. EXAM: PORTABLE CHEST 1 VIEW COMPARISON:  None. FINDINGS: Endotracheal tube is in place with the tip in good position 2.5 cm above the carina. OG tube courses into the stomach and below the inferior margin of the film. Lungs are clear. Heart size is normal. No pneumothorax or pleural fluid. No bony abnormality. IMPRESSION: ETT and NG tube in good position. Lungs clear.  No acute disease. Electronically Signed   By: Drusilla Kanner M.D.   On: 06/06/2017 16:14     DISCUSSION: 21 yo F w questionable PMH of ileus reportedly on dilaudid / amoxicillin who presented with LLQ abd pain.  Developed reported  seizure  ASSESSMENT / PLAN:  PULMONARY A: Intubated for Airway Protection S/P EXTUBATION 8/3  reamins extubated 06/08/2017 with normal cxr  P:   Monitor airway   CARDIOVASCULAR A:  Sinus tachy at rest - likely anxiety mediated  P:  Monitor   RENAL A:   No acute issues  Other htan very mild hypomag 06/08/2017  P:   Replete mag  GASTROINTESTINAL A:   Hx of previous illeus? Mild transaminitis - hx of ETOH, prior elevation CT abd negative 06/06/2017  8/5 - soft abdomen with improving LFT. Normal lipase  P:   Trend LFT  PPI for SUP  HEMATOLOGIC A:   No acute issues but ? suprious platelet drop 06/08/2017  P:  Trend CBC Heparin for DVT prophylaxis   INFECTIOUS A:   Bacterial vaginosis . No evidence of sepsis P:   Flagyl 500 mg BID x7 days monitor    ENDOCRINE A:   No acute issues P:   Trend glucose on BMET  NEUROLOGIC and TOXICOLOGY and PSYCH Results for SHIRLENA, BRINEGAR (MRN [id number]) as of 06/08/2017 13:07  Ref. Range 06/06/2017 15:30 06/06/2017 17:01  Salicylate Lvl Latest Ref Range: 2.8 - 30.0 mg/dL  <2.9  Amphetamines Latest Ref Range: NONE DETECTED  NONE DETECTED   Barbiturates Latest Ref Range: NONE DETECTED  NONE DETECTED   Benzodiazepines Latest Ref Range: NONE DETECTED  POSITIVE (A)   Opiates Latest Ref Range: NONE DETECTED  POSITIVE (A)   COCAINE Latest Ref Range: NONE DETECTED  POSITIVE (A)   Tetrahydrocannabinol Latest Ref Range: NONE DETECTED  POSITIVE (A)    A:   UDS poisite at admission for polysubstance Ongoing pseudoseizures in day -  No evidence of opioid withdrawal  P:   Continue empiric keprra Await psych consult -  Try empiric haldol x1 Dc precedex Prn fentany Prn ativan  FAMILY  - Updates: mom at beside. Strongly requesting [place] transfer 06/08/2017 because of logisitics and she herself is out of money.Marland Kitchen. HAve advised her psyuch consult first and then decide    Dr. Kalman ShanMurali La Dibella, M.D., [facility]P Pulmonary and  Critical Care Medicine Staff Physician [facility] Critical [facility] Pager: [phone], If no answer or between  15:00h - 7:00h: call [phone]  06/08/2017 1:09 PM

## 2017-06-08 NOTE — Progress Notes (Signed)
eLink Physician-Brief Progress Note Patient Name: Connie RutherfordSarah Mckay DOB: 01/31/1996 MRN: [id number]   Date of Service  06/08/2017  HPI/Events of Note  Nursing request to change Thiamine IM and Magnesium IV to PO route.   eICU Interventions  Will change Thiamine and Magnesium IV to PO.      Intervention Category Intermediate Interventions: Other:  Sommer,Steven Dennard Nipugene 06/08/2017, 4:08 PM

## 2017-06-08 NOTE — Progress Notes (Signed)
Family [place]. Family also requesting for the patient to be admitted for inpatient behavioral health if possible. MD is aware. Family expressed the need for overnight accommodations and that they are unable to pay for a hotel. Spoke with Chaplain on call regarding the [place] [facility]. Unfortunately they do not take families on the weekend, but family has been given the contact information so that arrangements can be made on Monday if needed.

## 2017-06-08 NOTE — Progress Notes (Signed)
Reason for consult: Spells concernig for seizures  Subjective: I was told by the nurse that since yesterday evening, she had 1 episode of jerking. She is complaining of severe abdominal pain.  ROS: negative except above   Examination  Vital signs in last 24 hours: Temp:  [96.8 F (36 C)-98.9 F (37.2 C)] 98.7 F (37.1 C) (08/05 2000) Pulse Rate:  [52-165] 71 (08/05 1900) Resp:  [13-44] 14 (08/05 1900) BP: (79-140)/(45-82) 101/59 (08/05 1900) SpO2:  [91 %-100 %] 99 % (08/05 1900) Weight:  [60.6 kg (133 lb 9.6 oz)] 60.6 kg (133 lb 9.6 oz) (08/05 0530)  General: Not in distress, cooperative Extremities: normal   Neuro: MS: Alert, oriented, follows commands Motor: normal strength in all 4 extremities Coordination: normal Gait: not tested  Basic Metabolic Panel:  Recent Labs Lab 06/06/17 1156 06/07/17 1227 06/07/17 2245 06/08/17 0633  NA 139 140 139  --   K 4.1 3.7 3.6  --   CL 105 110 111  --   CO2 24 21* 24  --   GLUCOSE 117* 86 98  --   BUN 12 8 7   --   CREATININE 0.71 0.65 0.68  --   CALCIUM 9.9 8.3* 8.3*  --   MG 2.0  --   --  1.8  PHOS  --   --   --  3.7    CBC:  Recent Labs Lab 06/06/17 1156 06/08/17 0633  WBC 10.1 3.6*  NEUTROABS  --  1.5*  HGB 14.1 10.4*  HCT 41.0 31.9*  MCV 83.5 84.6  PLT 244 126*     Coagulation Studies:  Recent Labs  06/08/17 0633  LABPROT 13.7  INR 1.05       ASSESSMENT AND PLAN  Non epileptic spells Patient improving and having less frequent spells.  The patient has  nonepileptic spells captured on video EEG.  No requirement for AEDs Continue management per psychiatry for substance abuse withdrawal Cognitive Behavorial therapy as outpatient.   Arachnoid Cyst Can obtain MRI Head as outpatient for incidental arachnoid cyst.   Georgiana SpinnerSushanth Esha Fincher MD [facility] [phone]  Neurology will sign off. Thanks for the consult.

## 2017-06-08 NOTE — Progress Notes (Signed)
1200 Pt having generalized jerking. 2mg  of Ativan given. Paged CCM. Orders given for 25 mcg of fentanyl, chest xray, and ammonia inhalant. MD coming to bedside.  Patients mother updated.

## 2017-06-08 NOTE — Progress Notes (Signed)
1537 called elink to have medications modified. Started Mag sulfate IV, patient stated it was burning and asked for it to be stopped. Medication changed to PO.

## 2017-06-09 DIAGNOSIS — F1920 Other psychoactive substance dependence, uncomplicated: Secondary | ICD-10-CM

## 2017-06-09 DIAGNOSIS — R109 Unspecified abdominal pain: Secondary | ICD-10-CM

## 2017-06-09 LAB — CBC WITH DIFFERENTIAL/PLATELET
Basophils Absolute: 0 10*3/uL (ref 0.0–0.1)
Basophils Relative: 1 %
EOS ABS: 0.2 10*3/uL (ref 0.0–0.7)
Eosinophils Relative: 4 %
HEMATOCRIT: 36.6 % (ref 36.0–46.0)
HEMOGLOBIN: 12.2 g/dL (ref 12.0–15.0)
LYMPHS ABS: 1.9 10*3/uL (ref 0.7–4.0)
Lymphocytes Relative: 43 %
MCH: 27.7 pg (ref 26.0–34.0)
MCHC: 33.3 g/dL (ref 30.0–36.0)
MCV: 83.2 fL (ref 78.0–100.0)
MONO ABS: 0.3 10*3/uL (ref 0.1–1.0)
MONOS PCT: 8 %
NEUTROS ABS: 1.9 10*3/uL (ref 1.7–7.7)
NEUTROS PCT: 44 %
Platelets: 169 10*3/uL (ref 150–400)
RBC: 4.4 MIL/uL (ref 3.87–5.11)
RDW: 13.3 % (ref 11.5–15.5)
WBC: 4.3 10*3/uL (ref 4.0–10.5)

## 2017-06-09 LAB — BASIC METABOLIC PANEL
ANION GAP: 6 (ref 5–15)
BUN: <5 mg/dL — ABNORMAL LOW (ref 6–20)
CO2: 28 mmol/L (ref 22–32)
Calcium: 8.8 mg/dL — ABNORMAL LOW (ref 8.9–10.3)
Chloride: 106 mmol/L (ref 101–111)
Creatinine, Ser: 0.73 mg/dL (ref 0.44–1.00)
GFR calc Af Amer: >60 mL/min (ref >60)
GLUCOSE: 102 mg/dL — AB (ref 65–99)
POTASSIUM: 3 mmol/L — AB (ref 3.5–5.1)
Sodium: 140 mmol/L (ref 135–145)

## 2017-06-09 LAB — PROCALCITONIN
Procalcitonin: <0.1 ng/mL
Procalcitonin: <0.1 ng/mL

## 2017-06-09 LAB — MAGNESIUM: MAGNESIUM: 2.1 mg/dL (ref 1.7–2.4)

## 2017-06-09 LAB — HEPATIC FUNCTION PANEL
ALK PHOS: 46 U/L (ref 38–126)
ALT: 106 U/L — AB (ref 14–54)
AST: 74 U/L — ABNORMAL HIGH (ref 15–41)
Albumin: 3.5 g/dL (ref 3.5–5.0)
BILIRUBIN DIRECT: 0.1 mg/dL (ref 0.1–0.5)
BILIRUBIN INDIRECT: 0.4 mg/dL (ref 0.3–0.9)
BILIRUBIN TOTAL: 0.5 mg/dL (ref 0.3–1.2)
Total Protein: 6.7 g/dL (ref 6.5–8.1)

## 2017-06-09 LAB — PHOSPHORUS: Phosphorus: 4 mg/dL (ref 2.5–4.6)

## 2017-06-09 LAB — GC/CHLAMYDIA PROBE AMP (~~LOC~~) NOT AT ARMC
CHLAMYDIA, DNA PROBE: POSITIVE — AB
NEISSERIA GONORRHEA: NEGATIVE

## 2017-06-09 LAB — PROTIME-INR
INR: 1.06
PROTHROMBIN TIME: 13.8 s (ref 11.4–15.2)

## 2017-06-09 MED ORDER — POTASSIUM CHLORIDE CRYS ER 20 MEQ PO TBCR
30.0000 meq | EXTENDED_RELEASE_TABLET | ORAL | Status: AC
  Administered 2017-06-09 (×2): 30 meq via ORAL
  Filled 2017-06-09 (×2): qty 1

## 2017-06-09 MED ORDER — METRONIDAZOLE 500 MG PO TABS
500.0000 mg | ORAL_TABLET | Freq: Two times a day (BID) | ORAL | Status: DC
  Administered 2017-06-09 – 2017-06-10 (×4): 500 mg via ORAL
  Filled 2017-06-09 (×4): qty 1

## 2017-06-09 MED ORDER — FENTANYL CITRATE (PF) 100 MCG/2ML IJ SOLN
12.5000 ug | INTRAMUSCULAR | Status: DC | PRN
  Administered 2017-06-09 – 2017-06-10 (×7): 25 ug via INTRAVENOUS
  Filled 2017-06-09 (×8): qty 2

## 2017-06-09 NOTE — Care Management Note (Addendum)
Case Management Note  Patient Details  Name: Connie Mckay MRN: [id number] Date of Birth: 01-31-1996  Subjective/Objective:    Pt admitted on 06/06/17 with non-epileptic spells.  PTA, pt independent, lives with sister in [place], [place].               Action/Plan: Met with pt to discuss dc plans.  She is tearful; states she wants to go to another hospital in [place], [place] so she can be near her family.  Mom is on phone on Facetime, wanting to know what the plan is for her daughter.  Bedside nurse to have provider speak with mother when able.  Currently, we are waiting for word from [place] regarding possible acute to acute transfer.  Pt has orders to transfer to regular bed, and per provider, may dc home tomorrow.  Psych MD has cleared and has recommended outpatient follow up for substance abuse counseling.  Will follow progress.    Expected Discharge Date:   (unknown)               Expected Discharge Plan:  Home/Self Care  In-House Referral:  Clinical Social Work  Discharge planning Services  CM Consult  Post Acute Care Choice:    Choice offered to:     DME Arranged:    DME Agency:     HH Arranged:    HH Agency:     Status of Service:  In process, will continue to follow  If discussed at Long Length of Stay Meetings, dates discussed:    Additional Comments:  Reinaldo Raddle, RN, BSN  Trauma/Neuro ICU Case Manager [phone]

## 2017-06-09 NOTE — Progress Notes (Signed)
[facility] ADULT [facility] REPLACEMENT PROTOCOL FOR AM LAB REPLACEMENT ONLY  The patient does apply for the [facility] Electrolyte Replacment Protocol based on the criteria listed below:   1. Is GFR >/= 40 ml/min? Yes.    Patient's GFR today is >60 2. Is urine output >/= 0.5 ml/kg/hr for the last 6 hours? Yes.   Patient's UOP is 1.7 ml/kg/hr 3. Is BUN < 60 mg/dL? Yes.    Patient's BUN today is <5 4. Abnormal electrolyte(s): K+3.0 5. Ordered repletion with: protocol 6. If a panic level lab has been reported, has the CCM MD in charge been notified? No..   Physician:  Sela HildingSood  Connie Mckay, Connie Fickleaul Mckay 06/09/2017 4:04 AM

## 2017-06-09 NOTE — Progress Notes (Signed)
Have spoken with patient's mother at length. She lives in [place] and does not have the means to be here with the patient. I have given her contact information for [place] [facility]. She states she cannot be here until Friday to pick her up. She is concerned about her daughters mental health if she is discharged and has requested inpatient treatment. She states her daughter has blue cross blue shield, but it has not been filed. Spoke with Merry ProudBrandi, NP and she too has spoken with patients mother.  The patient states she has a court date this week. Patient and mother requested a letter to be emailed to the law office stating she was admitted to the ICU on 8/4.

## 2017-06-09 NOTE — Progress Notes (Signed)
Called Mother - Connie Mckay and updated on patients status.  She states the patient has had a lot of psychological stresses lately.  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  The patient has been admitted for depression and suicide attempts in the past.  She feels powerless to help her daughter.  Reviewed pseudoseizure, psychiatric therapy.  Mother would like to see if she would voluntarily go [place].  Currently, she does not meet criteria for involuntary commitment.  She has no insurance and this will be a difficult process.  Reviewed pending medical work up with the parents as well.    Connie BrimBrandi Brailon Don, NP-C [facility] Pgr: [phone] or if no answer [phone] 06/09/2017, 12:44 PM

## 2017-06-09 NOTE — Progress Notes (Signed)
PULMONARY / CRITICAL CARE MEDICINE   Name: Connie Mckay MRN: [id number] DOB: 04/10/1996    ADMISSION DATE:  06/06/2017 CONSULTATION DATE:  06/06/2017  REFERRING MD:  Dr. Rhunette CroftNanavati  CHIEF COMPLAINT:  LLQ pain  BRIEF SUMMARY: 21 y/o F, polysubstance abuse, prior ETOH, who presented to [facility] on 8/3 with complaints of abdominal pain (reported hx of ileus and takes Dilaudid Q4 for pain).  Unable to take oral contrast.  Developed seizure like activity > intubated after ativan administration (note patient able to hold hand up against gravity during seizure and also give instructions regarding IV placement).  EEG negative for epileptiform activity.   SUBJECTIVE:  Pt complains of pain at prior lap surgical site.  Several episodes of pseudoseizures.     VITAL SIGNS: BP (!) 106/57   Pulse (!) 107   Temp 98.4 F (36.9 C) (Oral)   Resp (!) 23   Ht 5\' 2"  (1.575 m)   Wt 128 lb 4.9 oz (58.2 kg)   LMP 05/30/2017 (Approximate) Comment: pt intubated   SpO2 98%   BMI 23.47 kg/m   HEMODYNAMICS:    VENTILATOR SETTINGS:    INTAKE / OUTPUT: I/O last 3 completed shifts: In: 1856.7 [P.O.:400; I.V.:1456.7] Out: 2101 [Urine:2101]  PHYSICAL EXAMINATION: General: young adult female in NAD HEENT: MM pink/moist, no jvd PSY: calm/appropriate, witnessed pseudoseizure activity on arrival of boyfriend.  Patient responded to me and followed commands during event Neuro: AAOx4, speech clear, MAE  CV: s1s2 rrr, no m/r/g PULM: even/non-labored, lungs bilaterally clear  NW:GNFAGI:soft, non-tender, bsx4 active  Extremities: warm/dry, no edema  Skin: no rashes or lesions   LABS:  PULMONARY  Recent Labs Lab 06/07/17 1214  PHART 7.449  PCO2ART 32.3  PO2ART 94.0  HCO3 22.3  TCO2 23  O2SAT 98.0    CBC  Recent Labs Lab 06/06/17 1156 06/08/17 0633 06/09/17 0216  HGB 14.1 10.4* 12.2  HCT 41.0 31.9* 36.6  WBC 10.1 3.6* 4.3  PLT 244 126* 169    COAGULATION  Recent Labs Lab 06/08/17 0633  06/09/17 0216  INR 1.05 1.06    CARDIAC    Recent Labs Lab 06/07/17 1227 06/08/17 0633  TROPONINI <0.03 <0.03   No results for input(s): PROBNP in the last 168 hours.   CHEMISTRY  Recent Labs Lab 06/06/17 1156 06/07/17 1227 06/07/17 2245 06/08/17 0633 06/09/17 0216  NA 139 140 139  --  140  K 4.1 3.7 3.6  --  3.0*  CL 105 110 111  --  106  CO2 24 21* 24  --  28  GLUCOSE 117* 86 98  --  102*  BUN 12 8 7   --  <5*  CREATININE 0.71 0.65 0.68  --  0.73  CALCIUM 9.9 8.3* 8.3*  --  8.8*  MG 2.0  --   --  1.8 2.1  PHOS  --   --   --  3.7 4.0   Estimated Creatinine Clearance: 88.7 mL/min (by C-G formula based on SCr of 0.73 mg/dL).   LIVER  Recent Labs Lab 06/06/17 1156 06/07/17 1227 06/08/17 0633 06/09/17 0216  AST 90* 62* 56* 74*  ALT 150* 99* 89* 106*  ALKPHOS 57 41 36* 46  BILITOT 0.3 0.8 0.5 0.5  PROT 8.5* 6.1* 5.5* 6.7  ALBUMIN 4.6 3.3* 2.9* 3.5  INR  --   --  1.05 1.06     INFECTIOUS  Recent Labs Lab 06/07/17 1219 06/07/17 1227 06/07/17 2245 06/08/17 0633 06/09/17 0216  LATICACIDVEN 0.9  --  0.7  --   --   PROCALCITON  --  <0.10  --  <0.10 <0.10    ENDOCRINE CBG (last 3)   Recent Labs  06/06/17 1304  GLUCAP 120*    IMAGING  Dg Chest Port 1 View  Result Date: 06/08/2017 CLINICAL DATA:  Seizure. EXAM: PORTABLE CHEST 1 VIEW COMPARISON:  06/07/2017 FINDINGS: Both lungs are clear. Heart and mediastinum are within normal limits. Negative for a pneumothorax. Trachea is midline. Bony thorax is intact. Mild curvature in the spine may be related to patient positioning. IMPRESSION: No active disease. Electronically Signed   By: Richarda Overlie M.D.   On: 06/08/2017 12:59   STUDIES:  CT Abd 8/3 >> no acute abnormality in abdomen or pelvis.  Prior appendectomy.   Head CT 8/3 >> no acute abnormality, incidental finding of arachnoid cyst  EEG 8/4 >> normal EEG (note she had "seizure like events" during assessment) ABD [place] 8/6 >>   CULTURES: 8/3 Wet  prep >> clue cells, many WBCs 8/3 GC Probe >>   ANTIBIOTICS: Flagyl 8/3 (x7days) >>   LINES/TUBES: PIV  ETT 8/3 >> 8/3  SIGNIFICANT EVENTS: 8/03 Admitted, seizure, intubated > extubated  8/04 EEG c/w pseudoseizure, responds / follows commands during events   DISCUSSION: 21 yo F w questionable PMH of ileus reportedly on dilaudid / amoxicillin who presented with LLQ abd pain.  Developed reported seizure and intubated after ativan administration in ER.  Subsequently extubated 8/3 pm.    ASSESSMENT / PLAN:  PULMONARY A: Intubated for Airway Protection - in setting of pseudoseizure, extubated 8/3 pm  P:   IS, mobilize  CARDIOVASCULAR A:  SR / ST - episodes of ST with episodes of pseudoseizure P:  No acute interventions at this time Tele monitoring while in ICU   RENAL A:   Hypokalemia  Hypomagnesemia  P:   Trend BMP / urinary output Replace electrolytes as indicated Avoid nephrotoxic agents, ensure adequate renal perfusion  GASTROINTESTINAL A:   Hx of previous illeus ? - no record of such at [facility] Mild transaminitis - hx of ETOH, prior elevation.  CT abd negative 8/3.  Hx of Symptomatic Cholelithiasis s/p Laparoscopic Cholecystectomy (2016) Hx Appendectomy - 2012 P:   Trend LFT Assess ABD [place] for completeness > concern this is malingering behavior  HEMATOLOGIC A:   No acute issues  P:  Trend CBC  Heparin for DVT prophylaxis   INFECTIOUS A:   Bacterial vaginosis. No evidence of sepsis P:   Flagyl 500 mg PO BID x7 days total   NEUROLOGIC / PSY A:   Polysubstance Abuse - UDS positive at admission for polysubstance, evaluated by PSY while inpatient. 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  Patient updated on plan of care.    - Global:  Transfer to medical floor.  Await ABD [place].  To [facility] service in am.  If she is to discharge, please call PCCM back.  I have called [facility] for possible transfer and await return call from facility.    Canary Brim, NP-C [facility] Pgr: [phone] or if no answer [phone] 06/09/2017, 10:08 AM

## 2017-06-10 ENCOUNTER — Inpatient Hospital Stay (HOSPITAL_COMMUNITY): Payer: BLUE CROSS/BLUE SHIELD

## 2017-06-10 DIAGNOSIS — F1910 Other psychoactive substance abuse, uncomplicated: Secondary | ICD-10-CM

## 2017-06-10 DIAGNOSIS — J9601 Acute respiratory failure with hypoxia: Secondary | ICD-10-CM

## 2017-06-10 DIAGNOSIS — R740 Nonspecific elevation of levels of transaminase and lactic acid dehydrogenase [LDH]: Secondary | ICD-10-CM

## 2017-06-10 DIAGNOSIS — B9689 Other specified bacterial agents as the cause of diseases classified elsewhere: Secondary | ICD-10-CM

## 2017-06-10 DIAGNOSIS — N760 Acute vaginitis: Secondary | ICD-10-CM

## 2017-06-10 DIAGNOSIS — G930 Cerebral cysts: Secondary | ICD-10-CM

## 2017-06-10 DIAGNOSIS — R1084 Generalized abdominal pain: Secondary | ICD-10-CM

## 2017-06-10 DIAGNOSIS — G8929 Other chronic pain: Secondary | ICD-10-CM

## 2017-06-10 LAB — HEPATIC FUNCTION PANEL
ALBUMIN: 3.9 g/dL (ref 3.5–5.0)
ALT: 134 U/L — ABNORMAL HIGH (ref 14–54)
AST: 109 U/L — ABNORMAL HIGH (ref 15–41)
Alkaline Phosphatase: 51 U/L (ref 38–126)
Bilirubin, Direct: <0.1 mg/dL — ABNORMAL LOW (ref 0.1–0.5)
TOTAL PROTEIN: 7.1 g/dL (ref 6.5–8.1)
Total Bilirubin: 0.6 mg/dL (ref 0.3–1.2)

## 2017-06-10 LAB — BASIC METABOLIC PANEL
Anion gap: 10 (ref 5–15)
CHLORIDE: 106 mmol/L (ref 101–111)
CO2: 25 mmol/L (ref 22–32)
CREATININE: 0.59 mg/dL (ref 0.44–1.00)
Calcium: 9.3 mg/dL (ref 8.9–10.3)
Glucose, Bld: 93 mg/dL (ref 65–99)
POTASSIUM: 3.9 mmol/L (ref 3.5–5.1)
SODIUM: 141 mmol/L (ref 135–145)

## 2017-06-10 LAB — CBC WITH DIFFERENTIAL/PLATELET
BASOS PCT: 1 %
Basophils Absolute: 0 10*3/uL (ref 0.0–0.1)
EOS ABS: 0.2 10*3/uL (ref 0.0–0.7)
EOS PCT: 4 %
HCT: 39.5 % (ref 36.0–46.0)
Hemoglobin: 13.1 g/dL (ref 12.0–15.0)
LYMPHS ABS: 1.8 10*3/uL (ref 0.7–4.0)
Lymphocytes Relative: 42 %
MCH: 28 pg (ref 26.0–34.0)
MCHC: 33.2 g/dL (ref 30.0–36.0)
MCV: 84.4 fL (ref 78.0–100.0)
MONO ABS: 0.4 10*3/uL (ref 0.1–1.0)
MONOS PCT: 8 %
Neutro Abs: 2 10*3/uL (ref 1.7–7.7)
Neutrophils Relative %: 45 %
Platelets: 199 10*3/uL (ref 150–400)
RBC: 4.68 MIL/uL (ref 3.87–5.11)
RDW: 13.8 % (ref 11.5–15.5)
WBC: 4.4 10*3/uL (ref 4.0–10.5)

## 2017-06-10 LAB — PHOSPHORUS: Phosphorus: 4.7 mg/dL — ABNORMAL HIGH (ref 2.5–4.6)

## 2017-06-10 LAB — MAGNESIUM: MAGNESIUM: 2.3 mg/dL (ref 1.7–2.4)

## 2017-06-10 LAB — GAMMA GT: GGT: 20 U/L (ref 7–50)

## 2017-06-10 LAB — PREGNANCY, URINE: Preg Test, Ur: NEGATIVE

## 2017-06-10 MED ORDER — MUPIROCIN 2 % EX OINT
TOPICAL_OINTMENT | Freq: Two times a day (BID) | CUTANEOUS | Status: DC
  Administered 2017-06-10: 1 via NASAL
  Administered 2017-06-10: via NASAL
  Filled 2017-06-10 (×2): qty 22

## 2017-06-10 MED ORDER — PANTOPRAZOLE SODIUM 40 MG PO TBEC
40.0000 mg | DELAYED_RELEASE_TABLET | Freq: Every day | ORAL | Status: DC
  Administered 2017-06-10: 40 mg via ORAL
  Filled 2017-06-10: qty 1

## 2017-06-10 MED ORDER — NYSTATIN 100000 UNIT/ML MT SUSP
5.0000 mL | Freq: Four times a day (QID) | OROMUCOSAL | Status: DC
  Administered 2017-06-10: 500000 [IU] via ORAL
  Filled 2017-06-10 (×3): qty 5

## 2017-06-10 MED ORDER — IBUPROFEN 200 MG PO TABS
800.0000 mg | ORAL_TABLET | Freq: Three times a day (TID) | ORAL | Status: DC
Start: 2017-06-10 — End: 2017-06-11
  Administered 2017-06-10: 800 mg via ORAL
  Filled 2017-06-10: qty 4

## 2017-06-10 MED ORDER — GABAPENTIN 300 MG PO CAPS
300.0000 mg | ORAL_CAPSULE | Freq: Three times a day (TID) | ORAL | Status: DC
  Administered 2017-06-10: 300 mg via ORAL
  Filled 2017-06-10: qty 1

## 2017-06-10 NOTE — Plan of Care (Signed)
Problem: Fluid Volume: Goal: Ability to maintain a balanced intake and output will improve Outcome: Completed/Met Date Met: 06/10/17 On regular diet with sufficient fluid intake.

## 2017-06-10 NOTE — Progress Notes (Signed)
PROGRESS NOTE    Connie Mckay  VHQ:[id number] DOB: May 08, 1996 DOA: 06/06/2017 PCP: Patient, No Pcp Per   Brief Narrative:  21 year old HF PMHx Polysubstance abuse (positive for cocaine, marijuana). Chronic Abdominal Pain (on chronic narcotics), Pseudoseizure, Cholelithiasis S/P cholecystectomy.   le known past medical history, possibly including history of ileus   who presented to [facility] ED with complaints of LLQ pain.  Report, the patient and boyfriend were driving through [facility] when her abdominal pain became worse. She reportedly takes amoxicillin and Dilaudid Q4 for her abdominal pain.  Her boyfriend reports she used to drink alcohol but quit approximately 5 months ago. She also reports having elevated liver enzymes in the past.   Per triage notes patient stated she forgot her medications for her ileus and liver problems.  On EMR review, there is no previous visits or records in care everywhere.  At some point during the ER visit, she refused her CT scan of her abdomen due to nausea.  Labs remarkable for AST 90, ALT 150, glucose 117, with clue cells and many WBC on wet prep; hCG negative.  She was afebrile, normal WBC, and normal vital signs until she was witness to have a "staring spell" where she was not responding but able to hold her arm up to prevent gravitational fall into her face. Staff reports she also was attempting to make herself vomit due to pain and nausea. She had one episode of small volume bilous emesis.  She later was noted to have generalized "seizure" activity.  During the seizure episode, she was able to stop the RN from placing an IV in her left hand and instruct her to place it in her right hand. She also responded to ammonia during seizure activity.  She was treated with ativan and then intubated for airway protection.  Subsequently loaded with Keppra.  Subjective: 8/7  A/O 4, negative CP, negative SOB, negative N/V, positive abdominal pain out of proportion to clinical  signs. Patient begging taking for increased narcotics. States she's cocaine positive because somebody put cocaine in her marijuana and she did not know it.      Assessment & Plan:   Principal Problem:   Polysubstance (including opioids) dependence with physiol dependence (HCC) Active Problems:   Abdominal pain   Acute respiratory failure with hypoxia -Patient initially intubated for airway protection. 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 -Obtain MRI head as outpatient for incidental arachnoid cyst -Schedule follow-up appointment at [facility] 1-2 weeks for cognitive behavioral therapy -Per Psychiatry does not meet guidelines for involuntarily commitment. -Gabapentin per psychiatry  Chronic Abdominal Pain syndrome -Patient has multiple reasons for slight bump in her liver enzymes, polysubstance abuse, alcoholism. -Obtain acute hepatitis panel -Obtain HIV panel -Obtain GGT -CT abdomen/pelvis and abdominal ultrasound nondiagnostic for acute process see results below -If all vital panels negative and patient continues to have a dominant pain has spoken with PA Merilyn B[facility] who recommends outpatient follow-up.  -Start Protonix 40 mg daily -Start Motrin 800 mg TID -Increase Neurontin 300 mg TID -Counseled patient that chronic narcotic use is absolutely contraindicated, and can worsen her condition. -TCA initially selected as an agent for chronic abdominal pain syndrome however in a patient with multi-substance abuse issues will hold, allow either psychiatry or GI to start medication  -Schedule establish care [facility] 2-4 weeks postdischarge chronic abdominal pain syndrome  Mild transaminitis -Most likely secondary to play sepsis abuse, EtOH abuse -Trend LE -See  chronic abdominal pain syndrome -Negative history ileus -Hx appendectomy 2012, S/P cholelithiasis with cholecystectomy 2016  Polysubstance  Abuse -8/3 UDS positive benzodiazepine, positive opiates, positive cocaine, positive marijuana -Schedule follow-up [facility] polysubstance abuse, EtOH abuse detox program one week following discharge -Ativan withdrawal protocol -Negative evidence. Opioid Withdrawal, continue current fentanyl dose  Bacterial vaginosis -Negative evidence sepsis -Flagyl 500 mg  PO BID x 7 days -GC/Chlamydia/RPR panel pending         DVT prophylaxis: Subcutaneous heparin Code Status: Full Family Communication: None Disposition Plan: Discharge 8/8   Cons[facility]ants[facility]  [facility]   Procedures/Significant Events:  8/3 CT head WO contrast: Negative acute abnormality -Probable arachnoid cyst 3 cm, exerting mild mass effect on the left anterior medial temporal lobe 8/3 CT abdomen pelvis W contrast: No acute abnormality 8/7 Ultrasound abdomen:-Fatty liver infiltration. No acute findings   VENTILATOR SETTINGS: None   Cultures 8/3 genital wet prep: Positive clue cells 8/3 MRSA by PCR positive    Antimicrobials: Anti-infectives    Start     Stop   06/09/17 1115  metroNIDAZOLE (FLAGYL) tablet 500 mg     06/12/17 2359   06/06/17 2200  metroNIDAZOLE (FLAGYL) 50 mg/ml oral suspension 500 mg  Status:  Discontinued     06/09/17 1033   06/06/17 1600  cefTRIAXone (ROCEPHIN) injection 250 mg     06/06/17 1705   06/06/17 1600  azithromycin (ZITHROMAX) tablet 1,000 mg  Status:  Discontinued     06/06/17 1555       Devices None   LINES / TUBES:  None    Continuous Infusions: . sodium chloride 250 mL (06/07/17 1600)     Objective: Vitals:   06/10/17 0400 06/10/17 0500 06/10/17 0600 06/10/17 0700  BP: 105/74  103/80 (!) 85/62  Pulse: 88  75 81  Resp: (!) 25  12 13   Temp: 98.3 F (36.8 C)     TempSrc: Oral     SpO2: 98%  97% 100%  Weight:  125 lb 14.1 oz (57.1 kg)    Height:        Intake/Output Summary (Last 24 hours) at 06/10/17 [id number] Last  data filed at 06/09/17 2100  Gross per 24 hour  Intake              240 ml  Output                0 ml  Net              240 ml   Filed Weights   06/08/17 0530 06/09/17 0413 06/10/17 0500  Weight: 133 lb 9.6 oz (60.6 kg) 128 lb 4.9 oz (58.2 kg) 125 lb 14.1 oz (57.1 kg)    Examination:  General: A/O 4, No acute respiratory distress Neck:  Negative scars, masses, torticollis, lymphadenopathy, JVD Lungs: Clear to auscultation bilaterally without wheezes or crackles Cardiovascular: Regular rate and rhythm without murmur gallop or rub normal S1 and S2 Abdomen: Positive abdominal pain (WAY OUT OF PROPORTION TO exam findings), nondistended, positive soft, bowel sounds, no rebound, no ascites, no appreciable mass Extremities: No significant cyanosis, clubbing, or edema bilateral lower extremities Skin: Negative rashes, lesions, ulcers Psychiatric:  Negative depression, positive anxiety, negative fatigue, negative mania  Central nervous system:  Cranial nerves II through XII intact, tongue/uvula midline, all extremities muscle strength 5/5, sensation intact throughout, negative dysarthria, negative expressive aphasia, negative receptive aphasia.  .     Data Reviewed: Care during the described  time interval was provided by me .  I have reviewed this patient's available data, including medical history, events of note, physical examination, and all test results as part of my evaluation. I have personally reviewed and interpreted all radiology studies.  CBC:  Recent Labs Lab 06/06/17 1156 06/08/17 0633 06/09/17 0216 06/10/17 0357  WBC 10.1 3.6* 4.3 4.4  NEUTROABS  --  1.5* 1.9 2.0  HGB 14.1 10.4* 12.2 13.1  HCT 41.0 31.9* 36.6 39.5  MCV 83.5 84.6 83.2 84.4  PLT 244 126* 169 199   Basic Metabolic Panel:  Recent Labs Lab 06/06/17 1156 06/07/17 1227 06/07/17 2245 06/08/17 0633 06/09/17 0216 06/10/17 0357  NA 139 140 139  --  140 141  K 4.1 3.7 3.6  --  3.0* 3.9  CL 105 110  111  --  106 106  CO2 24 21* 24  --  28 25  GLUCOSE 117* 86 98  --  102* 93  BUN 12 8 7   --  <5* <5*  CREATININE 0.71 0.65 0.68  --  0.73 0.59  CALCIUM 9.9 8.3* 8.3*  --  8.8* 9.3  MG 2.0  --   --  1.8 2.1 2.3  PHOS  --   --   --  3.7 4.0 4.7*   GFR: Estimated Creatinine Clearance: 88 mL/min (by C-G formula based on SCr of 0.59 mg/dL). Liver Function Tests:  Recent Labs Lab 06/06/17 1156 06/07/17 1227 06/08/17 0633 06/09/17 0216 06/10/17 0357  AST 90* 62* 56* 74* 109*  ALT 150* 99* 89* 106* 134*  ALKPHOS 57 41 36* 46 51  BILITOT 0.3 0.8 0.5 0.5 0.6  PROT 8.5* 6.1* 5.5* 6.7 7.1  ALBUMIN 4.6 3.3* 2.9* 3.5 3.9    Recent Labs Lab 06/06/17 1156 06/07/17 1227  LIPASE 29  29 30   AMYLASE 81  --    No results for input(s): AMMONIA in the last 168 hours. Coagulation Profile:  Recent Labs Lab 06/08/17 0633 06/09/17 0216  INR 1.05 1.06   Cardiac Enzymes:  Recent Labs Lab 06/07/17 1227 06/08/17 0633  CKTOTAL 63  --   CKMB 0.8  --   TROPONINI <0.03 <0.03   BNP (last 3 results) No results for input(s): PROBNP in the last 8760 hours. HbA1C: No results for input(s): HGBA1C in the last 72 hours. CBG:  Recent Labs Lab 06/06/17 1304  GLUCAP 120*   Lipid Profile: No results for input(s): CHOL, HDL, LDLCALC, TRIG, CHOLHDL, LDLDIRECT in the last 72 hours. Thyroid Function Tests: No results for input(s): TSH, T4TOTAL, FREET4, T3FREE, THYROIDAB in the last 72 hours. Anemia Panel: No results for input(s): VITAMINB12, FOLATE, FERRITIN, TIBC, IRON, RETICCTPCT in the last 72 hours. Urine analysis:    Component Value Date/Time   COLORURINE YELLOW 06/06/2017 1530   APPEARANCEUR HAZY (A) 06/06/2017 1530   LABSPEC 1.023 06/06/2017 1530   PHURINE 5.0 06/06/2017 1530   GLUCOSEU NEGATIVE 06/06/2017 1530   HGBUR NEGATIVE 06/06/2017 1530   BILIRUBINUR NEGATIVE 06/06/2017 1530   KETONESUR 20 (A) 06/06/2017 1530   PROTEINUR NEGATIVE 06/06/2017 1530   NITRITE NEGATIVE  06/06/2017 1530   LEUKOCYTESUR NEGATIVE 06/06/2017 1530   Sepsis Labs: @LABRCNTIP (procalcitonin:4,lacticidven:4)  ) Recent Results (from the past 240 hour(s))  Wet prep, genital     Status: Abnormal   Collection Time: 06/06/17  2:18 PM  Result Value Ref Range Status   Yeast Wet Prep HPF POC NONE SEEN NONE SEEN Final   Trich, Wet Prep NONE SEEN NONE SEEN Final  Clue Cells Wet Prep HPF POC PRESENT (A) NONE SEEN Final   WBC, Wet Prep HPF POC MANY (A) NONE SEEN Final   Sperm NONE SEEN  Final  MRSA PCR Screening     Status: Abnormal   Collection Time: 06/06/17  8:50 PM  Result Value Ref Range Status   MRSA by PCR POSITIVE (A) NEGATIVE Final    Comment:        The GeneXpert MRSA Assay (FDA approved for NASAL specimens only), is one component of a comprehensive MRSA colonization surveillance program. It is not intended to diagnose MRSA infection nor to guide or monitor treatment for MRSA infections. RESULT CALLED TO, READ BACK BY AND VERIFIED WITH: B SCOTT AT 2311 ON 08.03.2018 BY NBROOKS          Radiology Studies: US Abdomen Complete  Result Date: 06/10/2017 CLINICAL DATA:  Abdominal pain for 4 days, history of prior cholecystectomy, history of hepatitis-C EXAM: ABDOMEN ULTRASOUND COMPLETE COMPARISON:  CT abdomen pelvis of 06/06/2017 FINDINGS: Gallbladder: The gallbladder has previously been resected. Common bile duct: Diameter: The common bile duct is normal measuring 3.2 mm in diameter. Liver: The liver is slightly echogenic suggesting fatty infiltration. No focal hepatic abnormality is seen. Normal direction of portal venous blood flow is noted. IVC: No abnormality visualized. Pancreas: The pancreas is moderately well visualized with no mass or evidence of ductal dilatation. Spleen: The spleen is slightly prominent measuring 11.5 cm. Right Kidney: Length: 12.7 cm.  No hydronephrosis is seen. Left Kidney: Length: 11.1 cm.  No hydronephrosis is noted. Abdominal aorta: The  abdominal aorta is normal in caliber. Other findings: None. IMPRESSION: 1. Prior cholecystectomy.  No ductal dilatation. 2. No abnormality of the liver is seen. 3. No hydronephrosis. 4. The pancreas is unremarkable by ultrasound. Electronically Signed   By: Dwyane Dee M.D.   On: 06/10/2017 08:36   Dg Chest Port 1 View  Result Date: 06/08/2017 CLINICAL DATA:  Seizure. EXAM: PORTABLE CHEST 1 VIEW COMPARISON:  06/07/2017 FINDINGS: Both lungs are clear. Heart and mediastinum are within normal limits. Negative for a pneumothorax. Trachea is midline. Bony thorax is intact. Mild curvature in the spine may be related to patient positioning. IMPRESSION: No active disease. Electronically Signed   By: Richarda Overlie M.D.   On: 06/08/2017 12:59        Scheduled Meds: . famotidine  20 mg Oral BID  . gabapentin  300 mg Oral BID  . heparin  5,000 Units Subcutaneous Q8H  . LORazepam  1 mg Oral TID   Followed by  . LORazepam  1 mg Oral BID   Followed by  . [START ON 06/12/2017] LORazepam  1 mg Oral Daily  . metroNIDAZOLE  500 mg Oral Q12H  . multivitamin with minerals  1 tablet Oral Daily  . thiamine  100 mg Oral Daily   Continuous Infusions: . sodium chloride 250 mL (06/07/17 1600)     LOS: 3 days    Time spent: 40 minutes    WOODS, Roselind Messier, MD [facility] Pager [phone]   If 7PM-7AM, please contact night-coverage [email or web address] Password [email or web address] 06/10/2017, 9:07 AM

## 2017-06-10 NOTE — Plan of Care (Signed)
Problem: Nutrition: Goal: Adequate nutrition will be maintained Outcome: Completed/Met Date Met: 06/10/17 Regular diet.  Great appetite.  Problem: Physical Regulation: Goal: Diagnostic test results will improve Outcome: Completed/Met Date Met: 06/10/17 Abdominal US completed & resulted 06/10/17

## 2017-06-10 NOTE — Plan of Care (Signed)
Problem: Safety: Goal: Ability to remain free from injury will improve Outcome: Completed/Met Date Met: 06/10/17 Floor mats, bed in low position with alarm on.  Patient aware of fall risk and is compliant with safety measures.

## 2017-06-10 NOTE — Progress Notes (Signed)
Nutrition Follow-up  INTERVENTION:   Continue to encourage intake at meals.   NUTRITION DIAGNOSIS:   Inadequate oral intake related to altered GI function, acute illness as evidenced by NPO status. Resolved.   GOAL:   Patient will meet greater than or equal to 90% of their needs Met.   MONITOR:   Diet advancement, I & O's  ASSESSMENT:   Pt with questionable PMH of ileus reportedly on dilaudid/amoxicillin admitted with LLQ pain and developed a seizure.   Pt discussed during ICU rounds and with RN.   Spoke with pt who reports that she has a good appetite. Reports that if she lost weight PTA it was only a few pounds. Denies ETOH/drug use PTA.  She is eating well currently. Meal Completion: 90% at breakfast.  States that PO intake has not impacted her abd pain that continues.   Nutrition-Focused physical exam completed. Findings are no fat depletion, no muscle depletion, and no edema.   Medications reviewed and include: flagyl, MVI, thiamine Labs reviewed: PO4 4.7 (H)   Diet Order:  Diet regular Room service appropriate? Yes; Fluid consistency: Thin  Skin:  Reviewed, no issues  Last BM:  8/6  Height:   Ht Readings from Last 1 Encounters:  06/09/17 _0  (1.575 m)    Weight:   Wt Readings from Last 1 Encounters:  06/10/17 125 lb 14.1 oz (57.1 kg)    Ideal Body Weight:  50 kg  BMI:  Body mass index is 23.02 kg/m.  Estimated Nutritional Needs:   Kcal:  1700-1900  Protein:  70-85 grams  Fluid:  > 2 L/day  EDUCATION NEEDS:   No education needs identified at this time  [place], [place], [place] Pager [phone] After Hours Pager

## 2017-06-11 LAB — PHOSPHORUS: Phosphorus: 4.9 mg/dL — ABNORMAL HIGH (ref 2.5–4.6)

## 2017-06-11 LAB — CBC WITH DIFFERENTIAL/PLATELET
Basophils Absolute: 0 10*3/uL (ref 0.0–0.1)
Basophils Relative: 0 %
EOS PCT: 4 %
Eosinophils Absolute: 0.2 10*3/uL (ref 0.0–0.7)
HEMATOCRIT: 37.3 % (ref 36.0–46.0)
Hemoglobin: 12.3 g/dL (ref 12.0–15.0)
LYMPHS PCT: 37 %
Lymphs Abs: 2.2 10*3/uL (ref 0.7–4.0)
MCH: 27.7 pg (ref 26.0–34.0)
MCHC: 33 g/dL (ref 30.0–36.0)
MCV: 84 fL (ref 78.0–100.0)
MONO ABS: 0.6 10*3/uL (ref 0.1–1.0)
MONOS PCT: 10 %
NEUTROS ABS: 3 10*3/uL (ref 1.7–7.7)
Neutrophils Relative %: 49 %
PLATELETS: 194 10*3/uL (ref 150–400)
RBC: 4.44 MIL/uL (ref 3.87–5.11)
RDW: 13.6 % (ref 11.5–15.5)
WBC: 6.1 10*3/uL (ref 4.0–10.5)

## 2017-06-11 LAB — HEPATITIS PANEL, ACUTE
HEP A IGM: NEGATIVE
HEP B S AG: NEGATIVE
Hep B C IgM: NEGATIVE

## 2017-06-11 LAB — HIV ANTIBODY (ROUTINE TESTING W REFLEX): HIV Screen 4th Generation wRfx: NONREACTIVE

## 2017-06-11 LAB — MAGNESIUM: Magnesium: 2.2 mg/dL (ref 1.7–2.4)

## 2017-06-11 LAB — GC/CHLAMYDIA PROBE AMP (~~LOC~~) NOT AT ARMC
Chlamydia: POSITIVE — AB
Neisseria Gonorrhea: NEGATIVE

## 2017-06-11 LAB — RPR: RPR: NONREACTIVE

## 2017-06-11 MED ORDER — AMMONIA AROMATIC IN INHA
0.3000 mL | Freq: Once | RESPIRATORY_TRACT | Status: DC
  Filled 2017-06-11: qty 10

## 2017-06-11 MED ORDER — HALOPERIDOL LACTATE 5 MG/ML IJ SOLN: 2.0000 mg | Freq: Once | INTRAMUSCULAR | Status: DC

## 2017-06-11 NOTE — Progress Notes (Addendum)
  [place] TEAM 1 - Stepdown/ICU TEAM  I did not have the opportunity to see this patient today, or to review her chart in detail, as she left AMA while I was attending to other patients.  I had not seen her previously.  At the end of the day when reviewing items in my "inbox" I received a message informing me that she had tested + for Chlamydia.  I called the number listed on her facesheet [phone]) and after a few rings got the message that "a voicemail mailbox has not been set up."  I called the number a second time and got the same message.    I then called the next number on the patient's emergency contact list which was her mother.  Her mother was able to give me the telephone number for the patient's "boyfriend" [phone]) explaining that the pt herself does not have a phone.  I called that number, and the patient herself answered.  I explained to her that her chlamydia test was positive.  I explained that it was very important for us to tx this, as failure to do so could lead to infertility, or even severe acute illness.  She voiced understanding.  I told her she needed to be treated with a single dose of antibiotic.  She asked me to call it in to the [facility] in [place].  I agreed to do so, and again reiterated to her how important it was for her to pick up this medication and to take it.  She acknowledged my instructions.  Azithromycin 1g po x1 called to [facility] [place].    Lonia BloodJeffrey T. McClung, MD [facility] Office  [phone] Pager - Text Page per Amion as per below:  On-Call/Text Page:      [email or web address]      password [email or web address]  If 7PM-7AM, please contact night-coverage [email or web address] Password [email or web address] 06/11/2017, 7:48 PM

## 2017-06-11 NOTE — Progress Notes (Signed)
Pt 5C13 started having "pseudo seizures/spells" this am around 8:20. Nurse and tech at bedside with patient and boyfriend. Vitals stable at that time. Pt bed padded, suction at bedside and oxygen also connected. Notified [facility]. Psych had also cleared patient previously. No orders received and patient and vitals stable at that time. Checked on patient again and spells had subsided. VSS and patient alert and oriented at the time. Vomited a small amount of phlegm with no airway difficulties. Nurse reiterated to patient and visitor that doctor and had been notified and no orders at this time. Also, mentioned that vitals were stable and would continue to monitor pt closely. Pt decided to leave AMA. Charge nurse and MD notified. Charge nurse and pt signed AMA paper and informed patient of the situation and protocols followed.

## 2017-06-18 NOTE — Discharge Summary (Signed)
PT LEFT AMA SUMMARY  Connie RutherfordSarah Mckay MRN - [id number] DOB - 06/27/1996  Date of Admission - 06/06/2017 Date LEFT AMA: 06/11/2017  Attending Physician:  Jetty DuhamelMCCLUNG,Sheralee Qazi T  Patient's PCP:  Patient, No Pcp Per  Disposition: LEFT AMA  Follow-up Appts:  Not able to be arranged or discussed as pt LEFT AMA  Diagnoses at time pt LEFT AMA: Acute respiratory failure with hypoxia Pseudoseizure/Arachnoid cyst Chronic Abdominal Pain syndrome Mild transaminitis Polysubstance Abuse Bacterial vaginosis  Initial presentation: I never saw the pt.  The following is the hx gleaned by Dr. Joseph ArtWoods the day prior:  21 year old F Hx Polysubstance abuse (positive for cocaine, marijuana). Chronic Abdominal Pain (on chronic narcotics), Pseudoseizure, Cholelithiasis S/P cholecystectomy who [facility] ED with complaints of LLQ pain. During the ER visit, she refused her CT scan of her abdomen due to nausea. Labs remarkable for AST 90, ALT 150, glucose 117, with clue cells and many WBC on wet prep; hCG negative. She was afebrile, normal WBC, and normal vital signs until she was witnessed to have a "staring spell" where she was not responding but able to hold her arm up to prevent gravitational fall into her face. Staff reports she also was attempting to make herself vomit due to pain and nausea. She had one episode of small volume bilous emesis. She later was noted to have generalized "seizure" activity. During the seizure episode, she was able to stop the RN from placing an IV in her left hand and instruct her to place it in her right hand. She also responded to ammonia during seizure activity. She was treatedwith ativan and then intubated for airway protection. Subsequentlyloaded with Keppra.  Hospital Course: Listed below are the active problems present, and the status of the care of these problems, at the time the pt decided to LEAVE AMA as per Dr. Joseph ArtWoods who was the last MD to see the pt prior to her  leaving:  Acute respiratory failure with hypoxia -Patient initially intubated for airway protection. 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 -Obtain MRI head as outpatient for incidental arachnoid cyst -[facility] for [facility] behavioral therapy -Per Psychiatry does not meet guidelines for involuntarily commitment. -Gabapentin per psychiatry  Chronic Abdominal Pain syndrome -Patient has multiple reasons for slight bump in her liver enzymes, polysubstance abuse, alcoholism. -Obtain acute hepatitis panel -Obtain HIV panel -Obtain GGT -CT abdomen/pelvis and abdominal ultrasound nondiagnostic for acute process see results below -If all vital panels negative and patient continues to have a dominant pain has spoken with PA Merilyn BabaSarah Gribben [facility] who recommends outpatient follow-up.  -Start Protonix 40 mg daily -Start Motrin 800 mg TID -Increase Neurontin 300 mg TID -Counseled patient that chronic narcotic use is absolutely contraindicated, and can worsen her condition. 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 Opioid Withdrawal, continue current  fentanyl dose  Bacterial vaginosis -Negative evidence sepsis -Flagyl 500 mg  PO BID x 7 days -GC/Chlamydia/RPR panel pending    Medication List    Unable to be finalized as pt LEFT AMA  Day of Discharge Wt Readings from Last 3 Encounters:  06/11/17 57 kg (125 lb 9.6 oz)   Temp Readings from Last 3 Encounters:  06/11/17 98.3 F (36.8 C) (Oral)   BP Readings from Last 3 Encounters:  06/11/17 112/74   Pulse Readings from Last 3 Encounters:  06/11/17 (!) 102    Physical Exam: Exam not able to be completed at time of d/c as pt LEFT AMA  4:44 PM 06/18/17  Lonia Blood, MD [facility] Office  [phone] Pager [phone]  On-Call/Text Page:      [email or web address]      password [email or web address]

## 2018-01-02 DEATH — deceased

## 2018-04-10 IMAGING — CT CT ABD-PELV W/ CM
2 of 4 series · 16 of 46 positions shown, 18 images · IV contrast (ISOVUE)
Comparison: None.

CLINICAL DATA: Left lower quadrant pain, nausea, and vomiting.
Seizure and subsequent intubation.

EXAM:
CT ABDOMEN AND PELVIS WITH CONTRAST
TECHNIQUE: Multidetector CT imaging of the abdomen and pelvis was performed
using the standard protocol following bolus administration of
intravenous contrast.
CONTRAST:  100mL [id number] IOPAMIDOL ([id number]) INJECTION 61%

[Series 2: abd/pel with · axial · 0.65mm/px · z∈[-854,-448]mm · 13 of 91 slices shown, 15 images]
[im 5/91  soft-tissue]
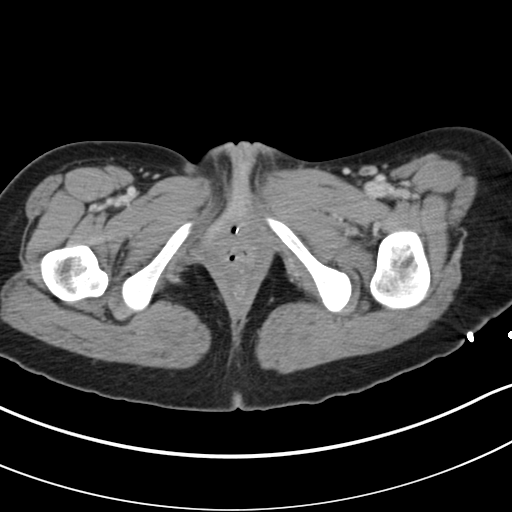
[im 5/91  bone]
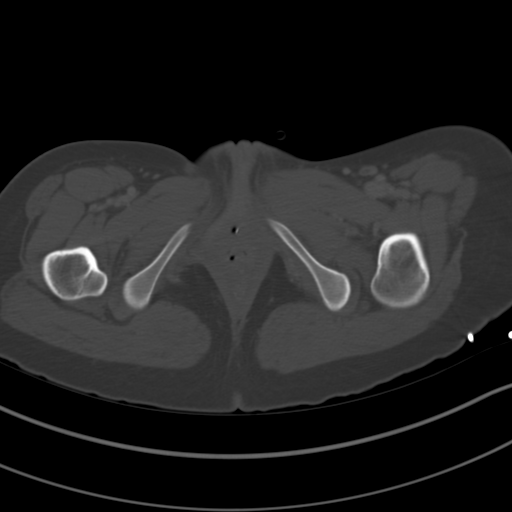
[im 15/91  soft-tissue]
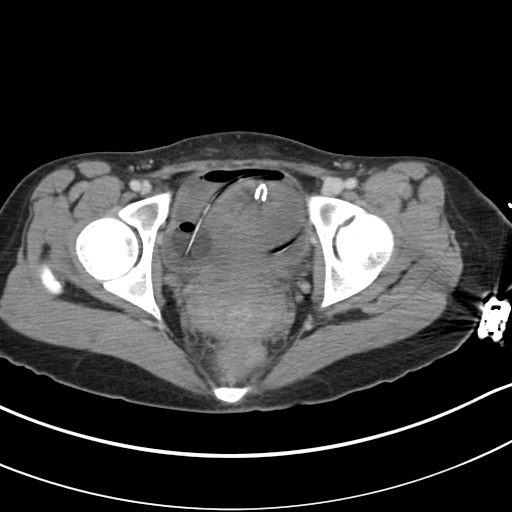
[im 19/91  soft-tissue]
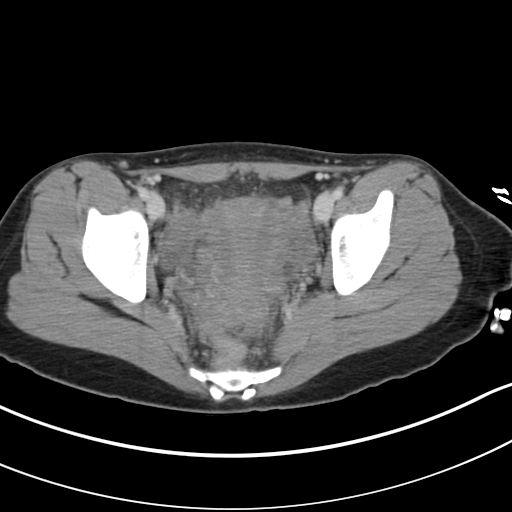
[im 24/91  soft-tissue]
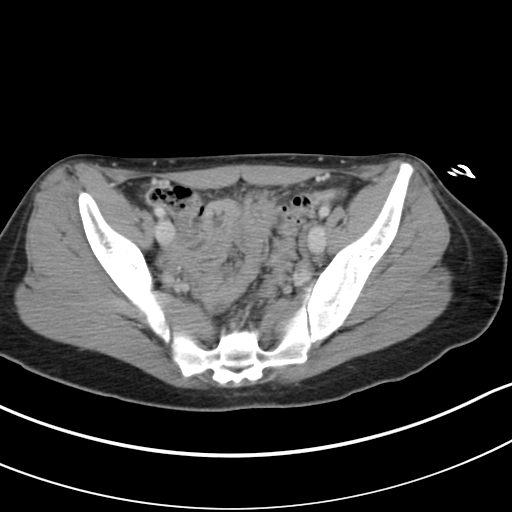
[im 34/91  soft-tissue]
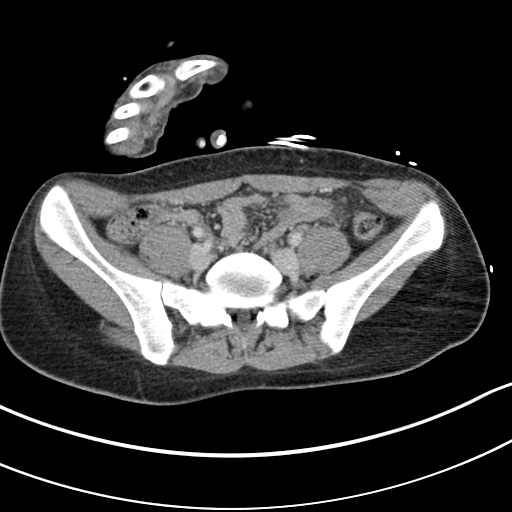
[im 38/91  soft-tissue]
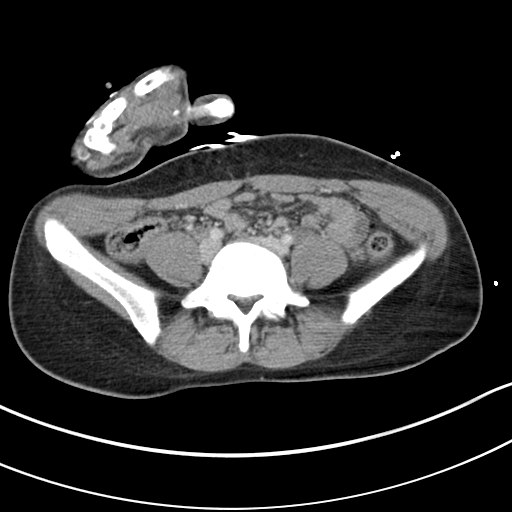
[im 48/91  soft-tissue]
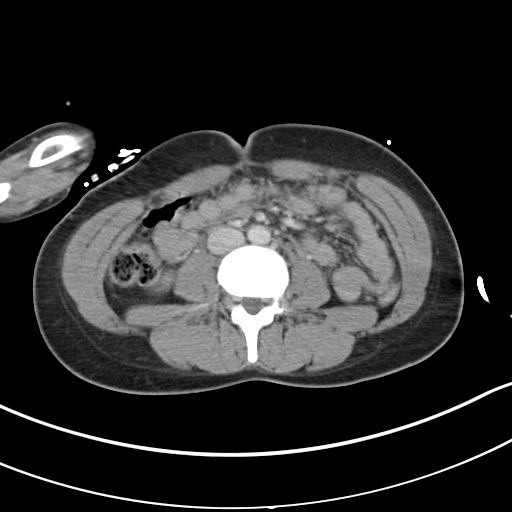
[im 53/91  soft-tissue]
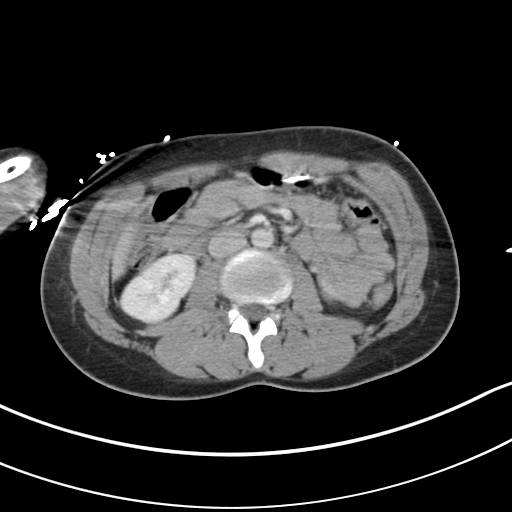
[im 57/91  soft-tissue]
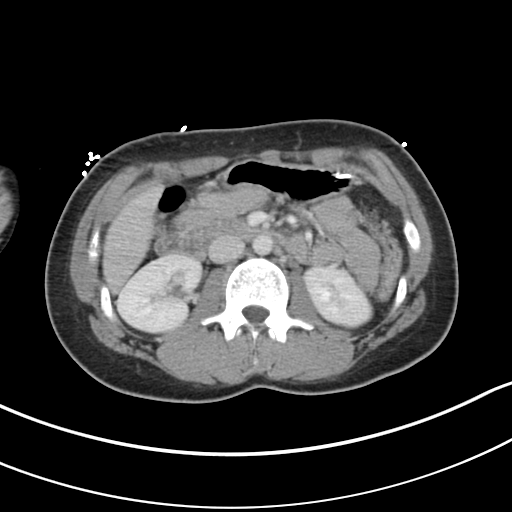
[im 57/91  bone]
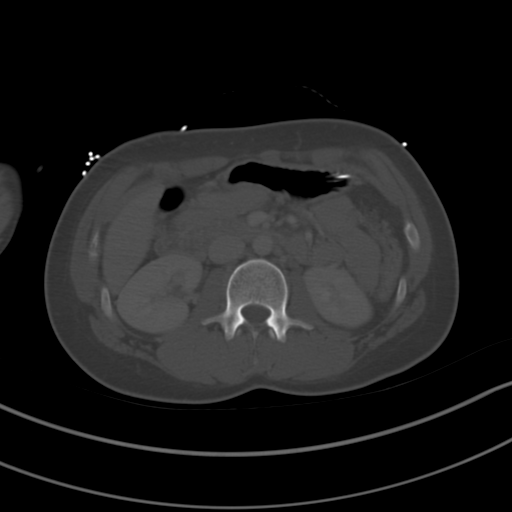
[im 67/91  soft-tissue]
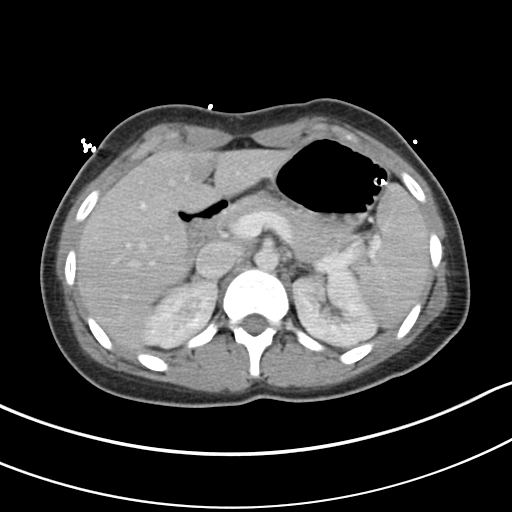
[im 72/91  soft-tissue]
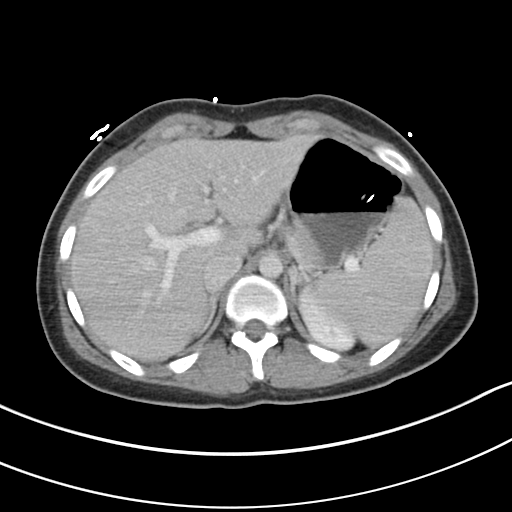
[im 76/91  soft-tissue]
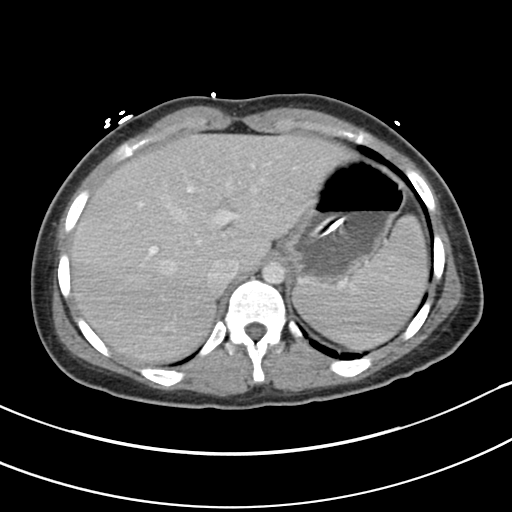
[im 86/91  soft-tissue]
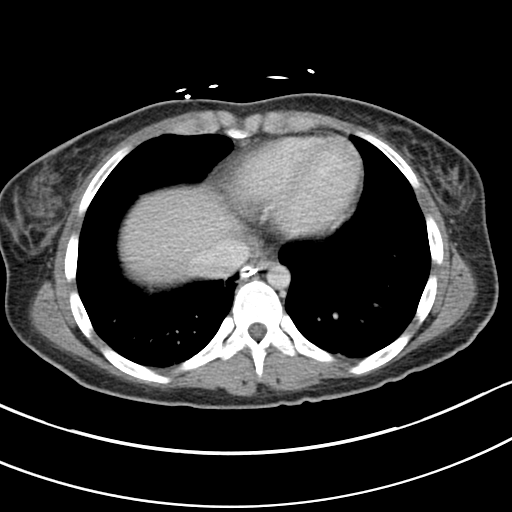

[Series 3: coronal a/|p · coronal · 0.83mm/px · 3 of 99 slices shown]
[im 33/99  soft-tissue]
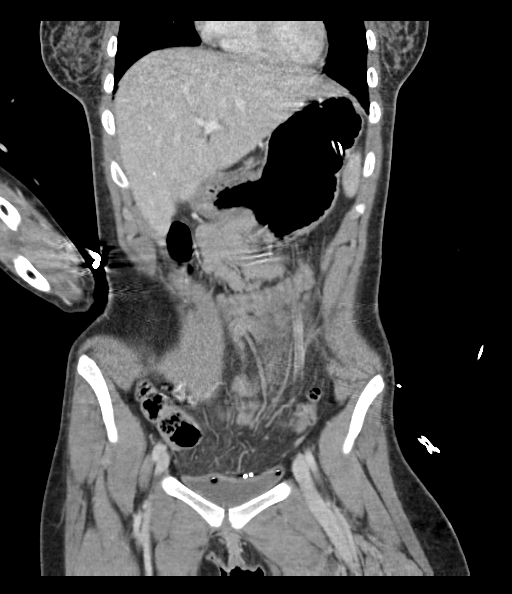
[im 44/99  soft-tissue]
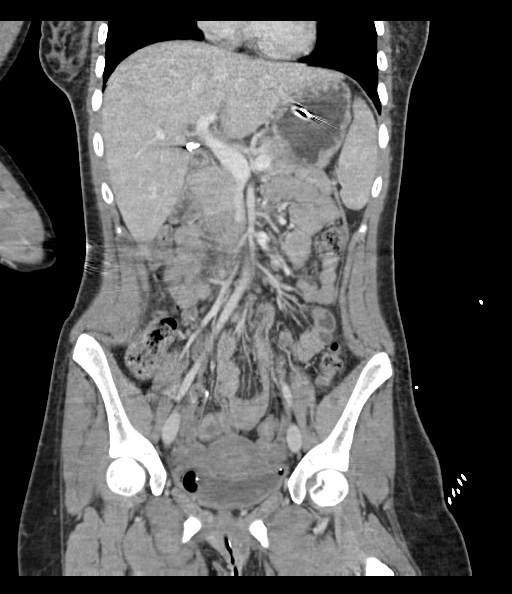
[im 55/99  soft-tissue]
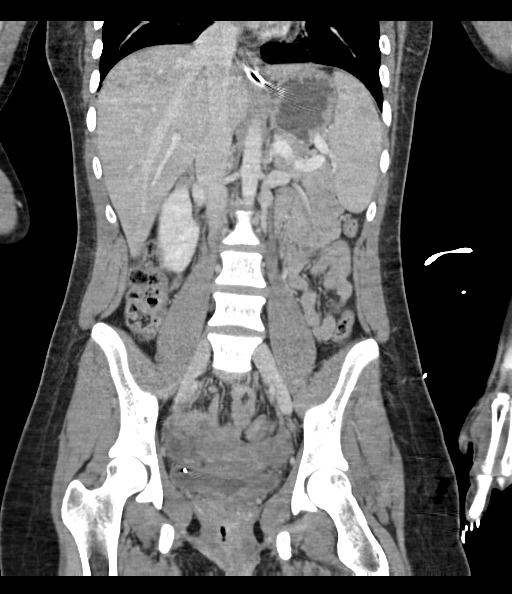

[16 of 46 positions shown; findings below may reference images not displayed]

FINDINGS: Lower chest: Minimal atelectasis in the lung bases. No pleural
effusion. Normal heart size.

Hepatobiliary: No focal liver abnormality is seen. Status post
cholecystectomy. No biliary dilatation.

Pancreas: Unremarkable.

Spleen: Unremarkable.

Adrenals/Urinary Tract: Unremarkable adrenal glands. No evidence of
renal mass, calculi, or hydronephrosis. Foley catheter and small
volume fluid in the bladder. Small clip or other metallic device
just superior to the bladder in the pelvis.

Stomach/Bowel: Enteric tube and small volume fluid in the stomach.
Mild gaseous distention of the stomach. No evidence of bowel
obstruction. The small and large bowel are largely decompressed,
limiting evaluation for wall thickening. Prior appendectomy.

Vascular/Lymphatic: No significant vascular findings are present. No
enlarged abdominal or pelvic lymph nodes.

Reproductive: Uterus and bilateral adnexa are unremarkable.

Other: At most trace free fluid in the pelvis. No pneumoperitoneum.
No abdominal wall mass or hernia.

Musculoskeletal: No acute osseous abnormality or suspicious osseous
lesion.
IMPRESSION: No acute abnormality identified in the abdomen or pelvis.

## 2018-04-12 IMAGING — DX DG CHEST 1V PORT
1 series · 1 of 1 positions shown · non-contrast
Comparison: 06/07/2017

CLINICAL DATA: Seizure.

EXAM:
PORTABLE CHEST 1 VIEW

[chest ap]
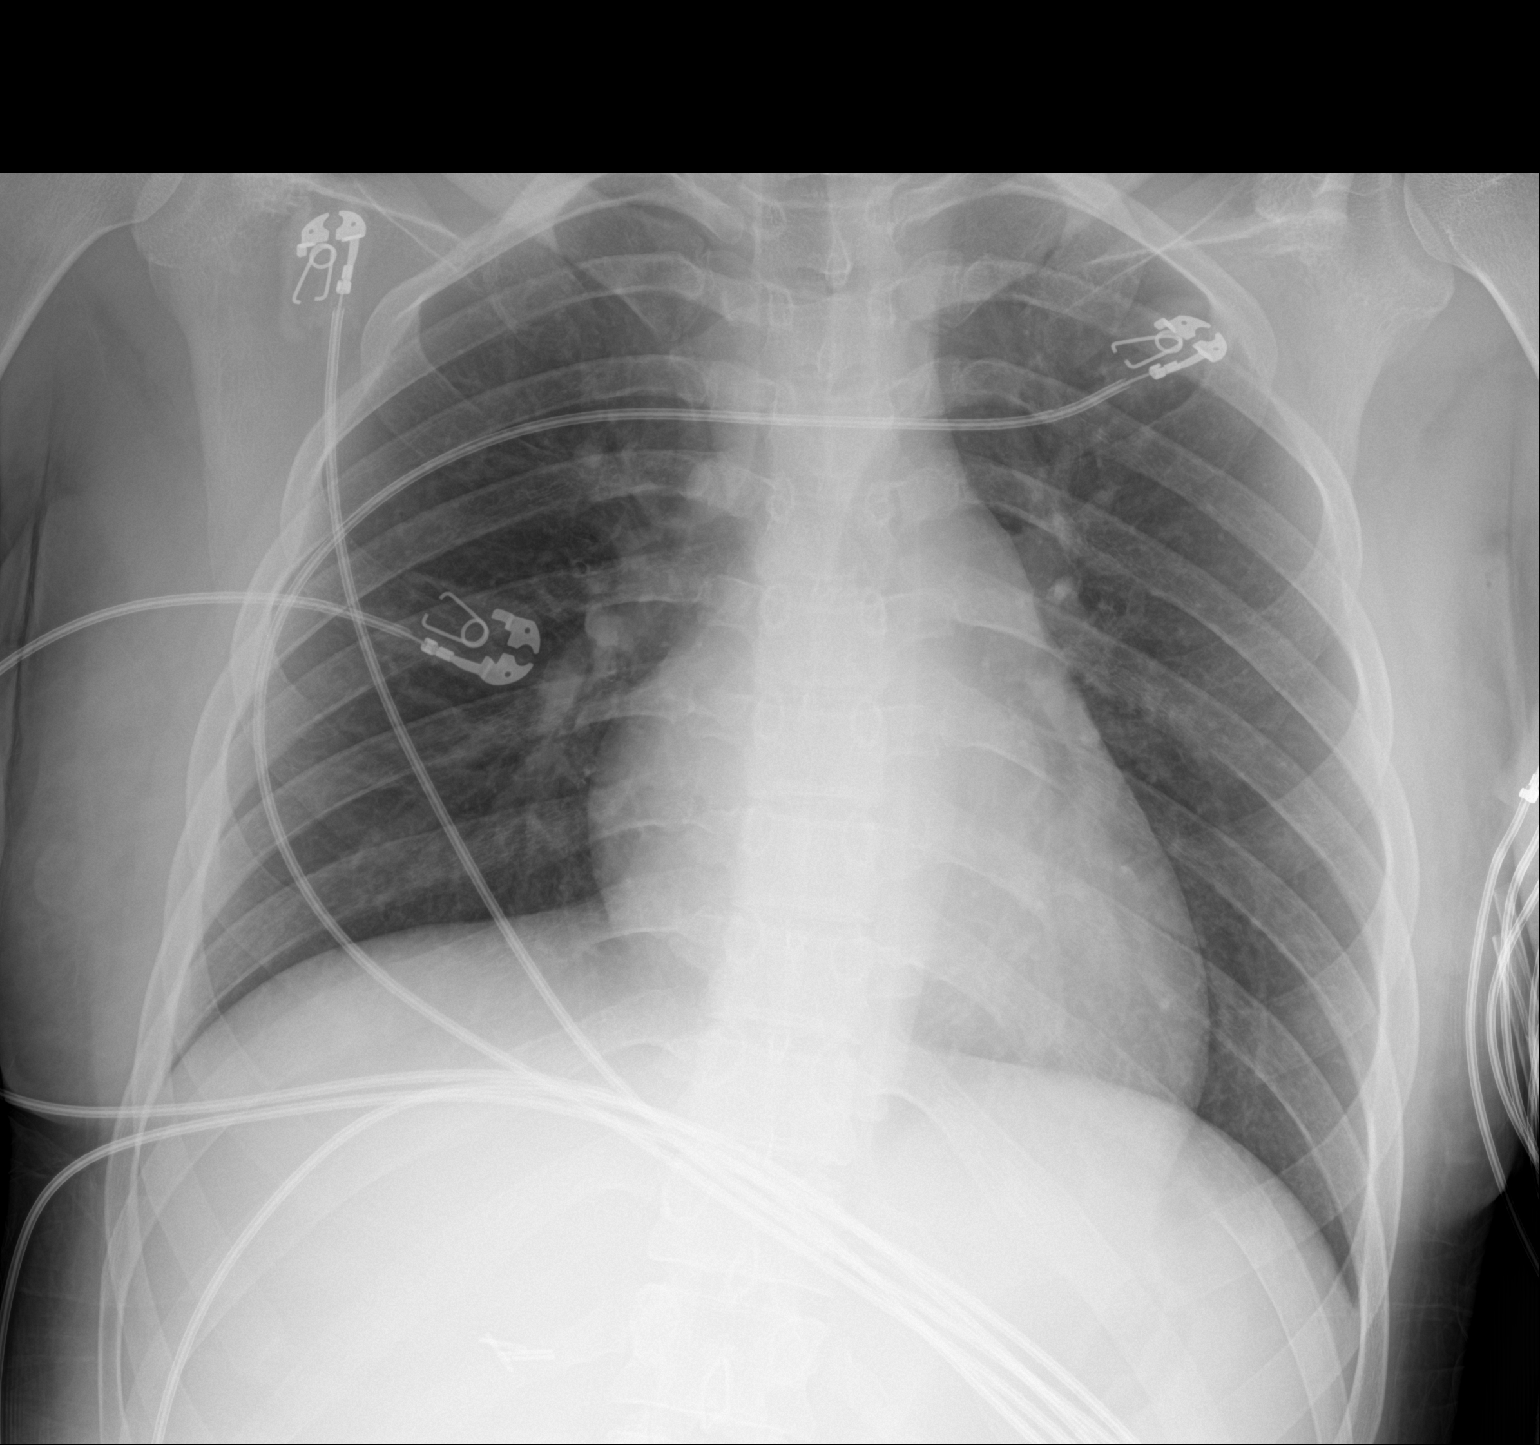

[1 of 1 positions shown; findings below may reference images not displayed]

FINDINGS: Both lungs are clear. Heart and mediastinum are within normal
limits. Negative for a pneumothorax. Trachea is midline. Bony thorax
is intact. Mild curvature in the spine may be related to patient
positioning.
IMPRESSION: No active disease.

## 2018-06-09 IMAGING — US US ABDOMEN COMPLETE
1 series · 14 of 25 positions shown · non-contrast
Comparison: CT abdomen pelvis of 06/06/2017

CLINICAL DATA: Abdominal pain for 4 days, history of prior
cholecystectomy, history of hepatitis-C

EXAM:
ABDOMEN ULTRASOUND COMPLETE

[Series 1: us abdomen complete · 0.23mm/px · 14 of 71 slices shown]
[im 1/71]
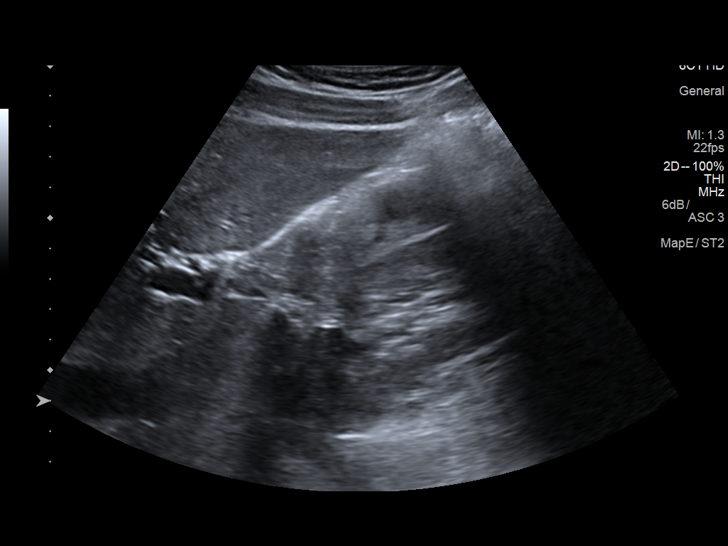
[im 6/71]
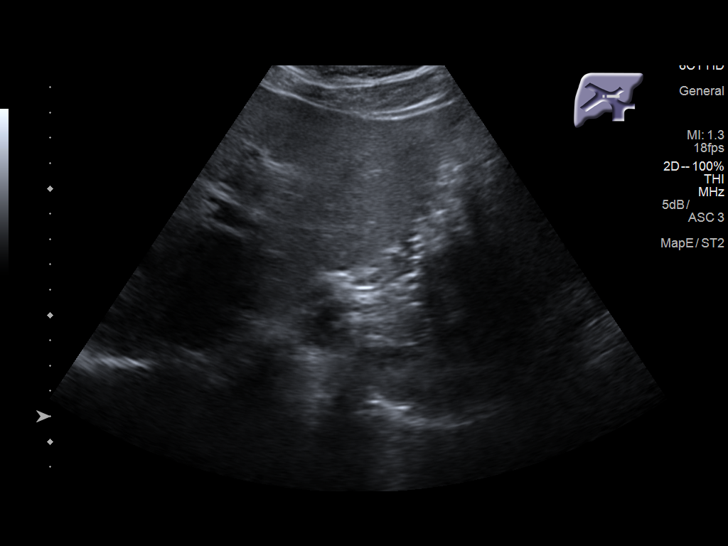
[im 12/71]
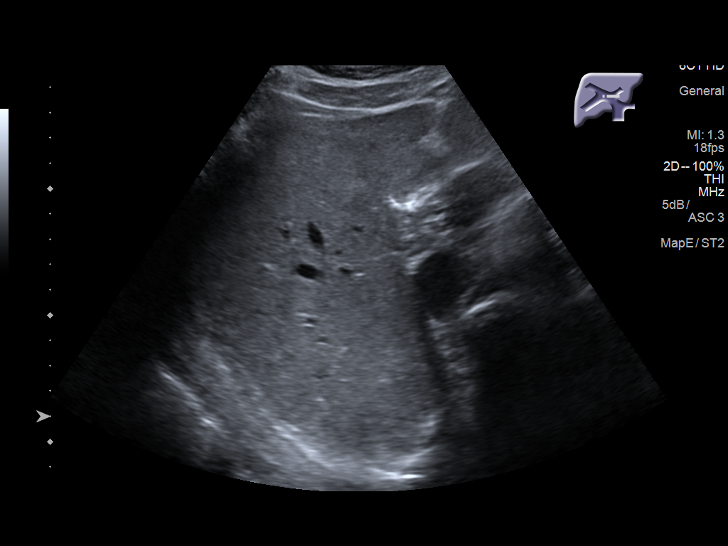
[im 18/71]
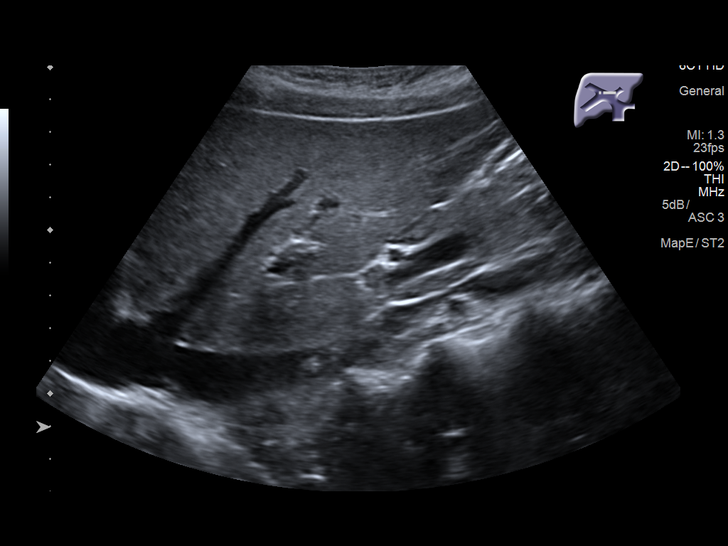
[im 24/71]
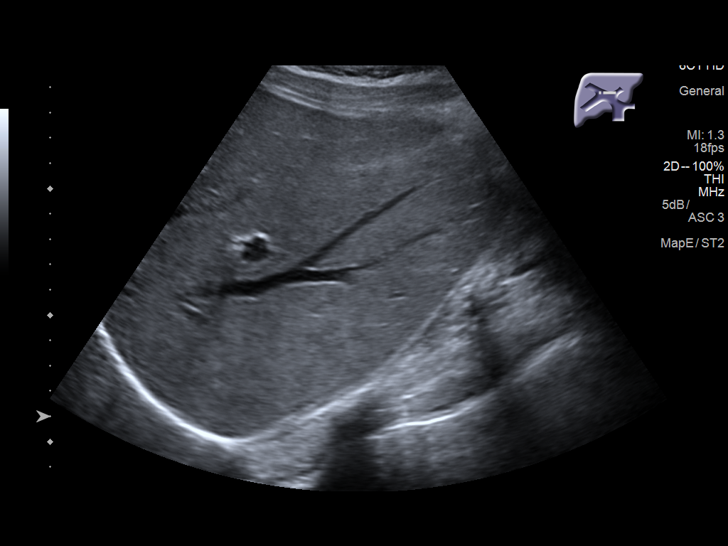
[im 27/71]
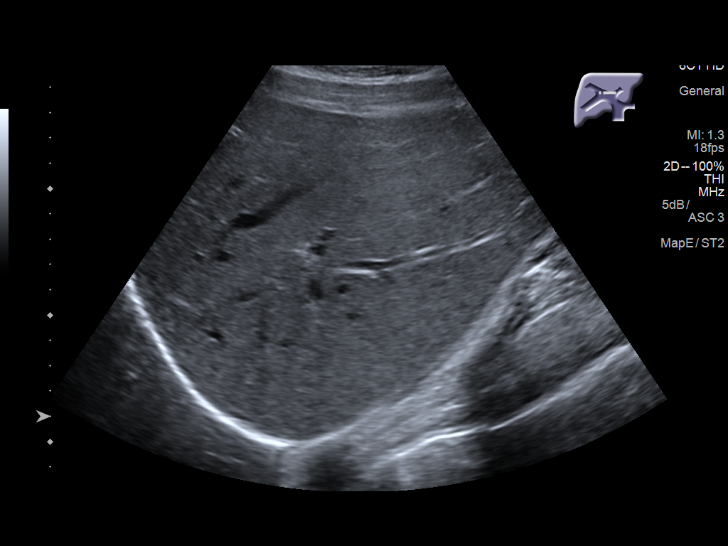
[im 33/71]
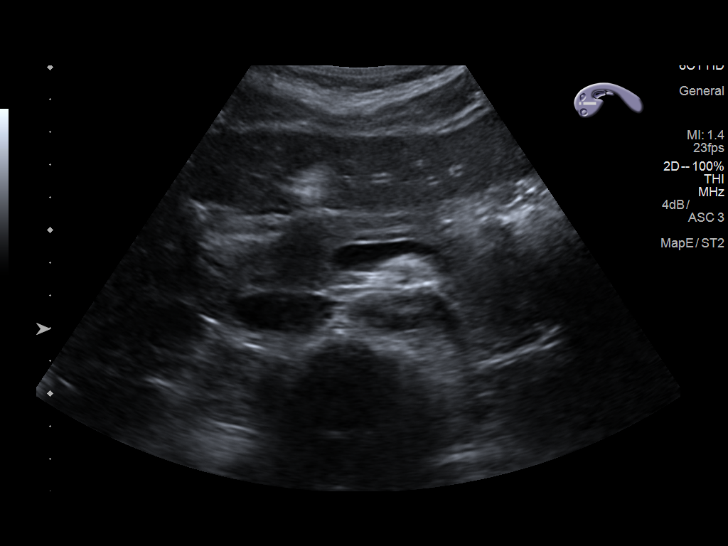
[im 38/71]
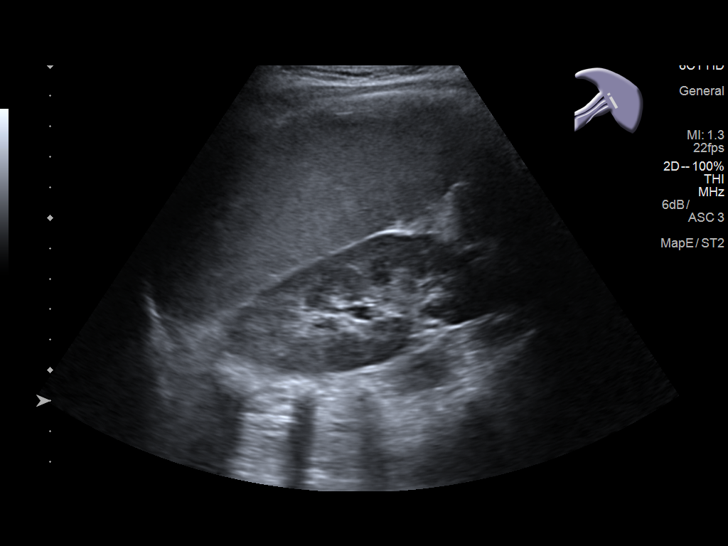
[im 44/71]
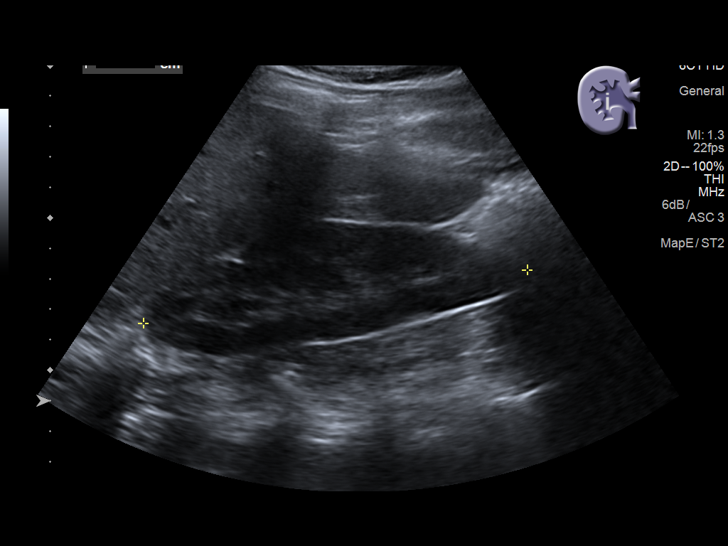
[im 47/71]
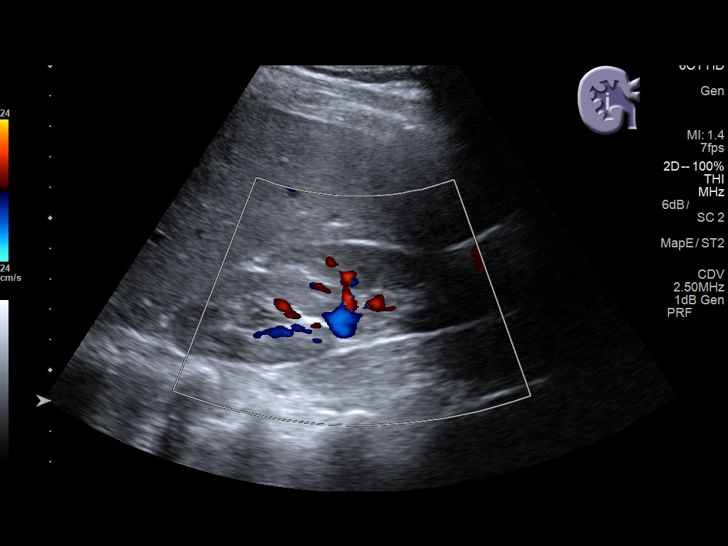
[im 53/71]
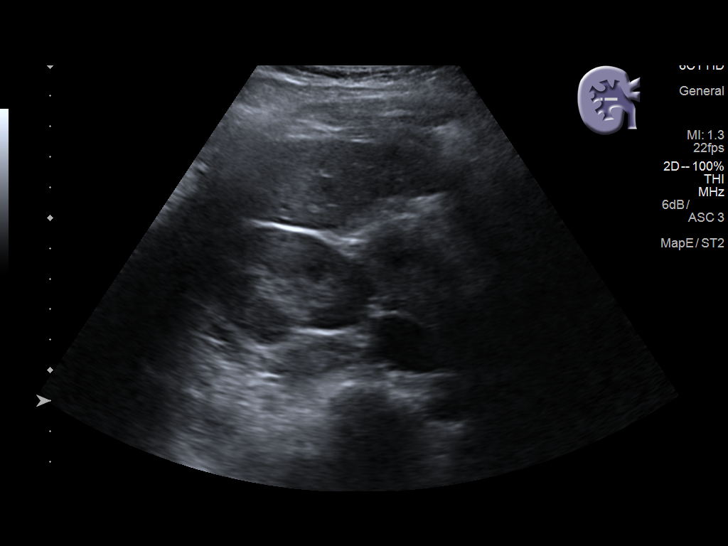
[im 59/71]
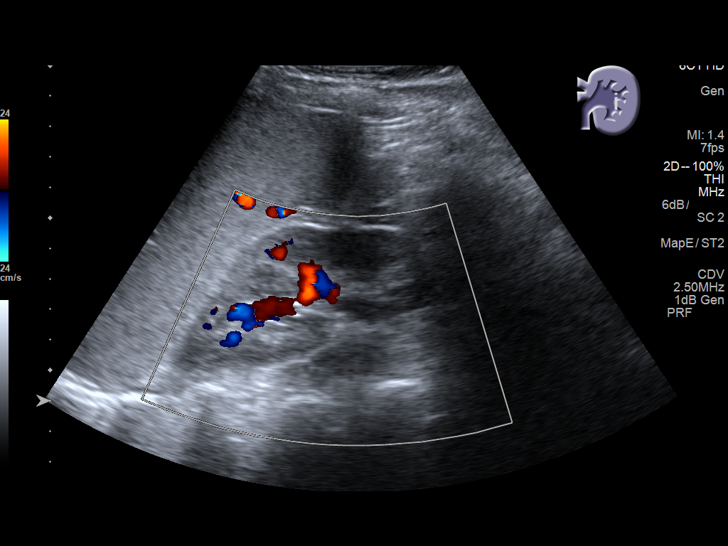
[im 65/71]
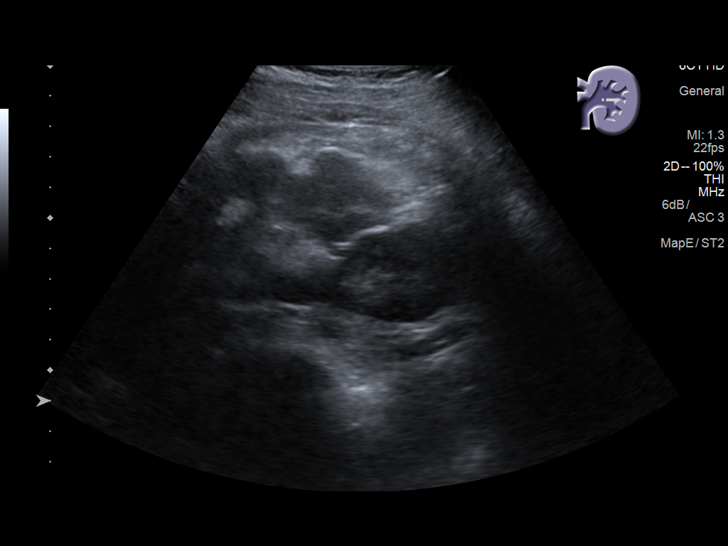
[im 71/71]
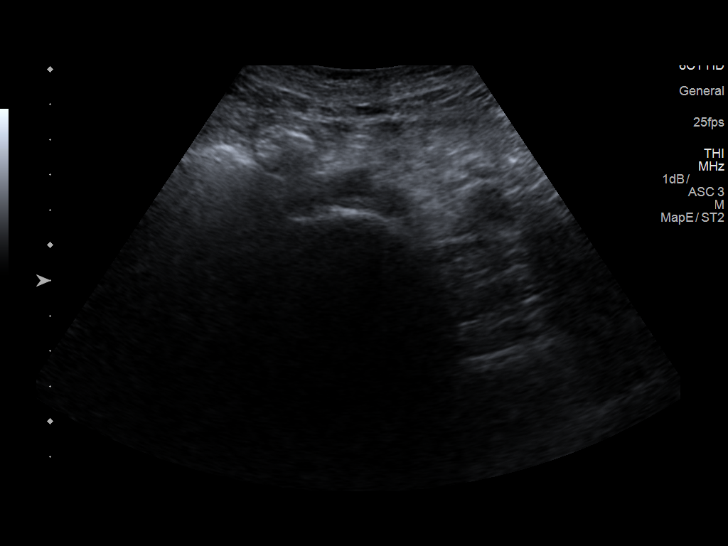

[14 of 25 positions shown; findings below may reference images not displayed]

FINDINGS: Gallbladder: The gallbladder has previously been resected.

Common bile duct: Diameter: The common bile duct is normal measuring
3.2 mm in diameter.

Liver: The liver is slightly echogenic suggesting fatty
infiltration. No focal hepatic abnormality is seen. Normal direction
of portal venous blood flow is noted.

IVC: No abnormality visualized.

Pancreas: The pancreas is moderately well visualized with no mass or
evidence of ductal dilatation.

Spleen: The spleen is slightly prominent measuring 11.5 cm.

Right Kidney: Length: 12.7 cm..  No hydronephrosis is seen.

Left Kidney: Length: 11.1 cm..  No hydronephrosis is noted.

Abdominal aorta: The abdominal aorta is normal in caliber.

Other findings: None.
IMPRESSION: 1. Prior cholecystectomy.  No ductal dilatation.
2. No abnormality of the liver is seen.
3. No hydronephrosis.
4. The pancreas is unremarkable by ultrasound.
